# Patient Record
Sex: Male | Born: 1960 | Race: Black or African American | Hispanic: No | Marital: Married | Smoking: Former smoker
Health system: Southern US, Community
[De-identification: ages and names within clinical notes are randomized; demographics above are authoritative.]

## PROBLEM LIST (undated history)

## (undated) DIAGNOSIS — R011 Cardiac murmur, unspecified: Secondary | ICD-10-CM

## (undated) DIAGNOSIS — K4090 Unilateral inguinal hernia, without obstruction or gangrene, not specified as recurrent: Secondary | ICD-10-CM

## (undated) DIAGNOSIS — M549 Dorsalgia, unspecified: Secondary | ICD-10-CM

## (undated) DIAGNOSIS — C61 Malignant neoplasm of prostate: Secondary | ICD-10-CM

## (undated) HISTORY — PX: PROSTATE BIOPSY: SHX241

## (undated) HISTORY — PX: HERNIA REPAIR: SHX51

---

## 1998-03-31 ENCOUNTER — Emergency Department (HOSPITAL_COMMUNITY): Admission: EM | Admit: 1998-03-31 | Discharge: 1998-03-31 | Payer: Self-pay | Admitting: Emergency Medicine

## 1998-05-16 ENCOUNTER — Emergency Department (HOSPITAL_COMMUNITY): Admission: EM | Admit: 1998-05-16 | Discharge: 1998-05-16 | Payer: Self-pay | Admitting: Emergency Medicine

## 1998-07-10 ENCOUNTER — Emergency Department (HOSPITAL_COMMUNITY): Admission: EM | Admit: 1998-07-10 | Discharge: 1998-07-10 | Payer: Self-pay | Admitting: Emergency Medicine

## 1998-07-24 ENCOUNTER — Encounter: Admission: RE | Admit: 1998-07-24 | Discharge: 1998-10-22 | Payer: Self-pay | Admitting: *Deleted

## 1999-07-05 ENCOUNTER — Emergency Department (HOSPITAL_COMMUNITY): Admission: EM | Admit: 1999-07-05 | Discharge: 1999-07-05 | Payer: Self-pay | Admitting: *Deleted

## 1999-07-06 ENCOUNTER — Emergency Department (HOSPITAL_COMMUNITY): Admission: EM | Admit: 1999-07-06 | Discharge: 1999-07-06 | Payer: Self-pay | Admitting: *Deleted

## 2001-12-17 ENCOUNTER — Emergency Department (HOSPITAL_COMMUNITY): Admission: EM | Admit: 2001-12-17 | Discharge: 2001-12-17 | Payer: Self-pay

## 2003-03-30 ENCOUNTER — Emergency Department (HOSPITAL_COMMUNITY): Admission: EM | Admit: 2003-03-30 | Discharge: 2003-03-30 | Payer: Self-pay | Admitting: Emergency Medicine

## 2006-07-20 ENCOUNTER — Emergency Department (HOSPITAL_COMMUNITY): Admission: EM | Admit: 2006-07-20 | Discharge: 2006-07-20 | Payer: Self-pay | Admitting: Emergency Medicine

## 2007-07-02 ENCOUNTER — Emergency Department (HOSPITAL_COMMUNITY): Admission: EM | Admit: 2007-07-02 | Discharge: 2007-07-02 | Payer: Self-pay | Admitting: Emergency Medicine

## 2007-08-24 ENCOUNTER — Ambulatory Visit: Admission: RE | Admit: 2007-08-24 | Discharge: 2007-08-24 | Payer: Self-pay | Admitting: Internal Medicine

## 2007-08-26 ENCOUNTER — Ambulatory Visit (HOSPITAL_COMMUNITY): Admission: RE | Admit: 2007-08-26 | Discharge: 2007-08-26 | Payer: Self-pay | Admitting: Internal Medicine

## 2008-05-29 ENCOUNTER — Emergency Department (HOSPITAL_COMMUNITY): Admission: EM | Admit: 2008-05-29 | Discharge: 2008-05-29 | Payer: Self-pay | Admitting: Emergency Medicine

## 2008-05-30 ENCOUNTER — Ambulatory Visit: Payer: Self-pay | Admitting: Vascular Surgery

## 2008-05-30 ENCOUNTER — Ambulatory Visit (HOSPITAL_COMMUNITY): Admission: RE | Admit: 2008-05-30 | Discharge: 2008-05-30 | Payer: Self-pay | Admitting: Internal Medicine

## 2008-05-30 ENCOUNTER — Encounter (INDEPENDENT_AMBULATORY_CARE_PROVIDER_SITE_OTHER): Payer: Self-pay | Admitting: Family Medicine

## 2010-08-19 ENCOUNTER — Encounter: Payer: Self-pay | Admitting: Internal Medicine

## 2014-05-27 ENCOUNTER — Emergency Department (HOSPITAL_COMMUNITY)
Admission: EM | Admit: 2014-05-27 | Discharge: 2014-05-27 | Disposition: A | Discharge status: Home / Self Care | Payer: BC Managed Care – PPO | Attending: Emergency Medicine | Admitting: Emergency Medicine

## 2014-05-27 ENCOUNTER — Encounter (HOSPITAL_COMMUNITY): Payer: Self-pay | Admitting: Emergency Medicine

## 2014-05-27 DIAGNOSIS — Z792 Long term (current) use of antibiotics: Secondary | ICD-10-CM | POA: Insufficient documentation

## 2014-05-27 DIAGNOSIS — J329 Chronic sinusitis, unspecified: Secondary | ICD-10-CM | POA: Insufficient documentation

## 2014-05-27 DIAGNOSIS — R51 Headache: Secondary | ICD-10-CM | POA: Diagnosis present

## 2014-05-27 DIAGNOSIS — Z87891 Personal history of nicotine dependence: Secondary | ICD-10-CM | POA: Insufficient documentation

## 2014-05-27 DIAGNOSIS — R519 Headache, unspecified: Secondary | ICD-10-CM

## 2014-05-27 HISTORY — DX: Dorsalgia, unspecified: M54.9

## 2014-05-27 MED ORDER — DIPHENHYDRAMINE HCL 25 MG PO CAPS
25.0000 mg | ORAL_CAPSULE | Freq: Once | ORAL | Status: AC
  Administered 2014-05-27: 25 mg via ORAL
  Filled 2014-05-27: qty 1

## 2014-05-27 MED ORDER — KETOROLAC TROMETHAMINE 60 MG/2ML IM SOLN
60.0000 mg | Freq: Once | INTRAMUSCULAR | Status: AC
  Administered 2014-05-27: 60 mg via INTRAMUSCULAR
  Filled 2014-05-27: qty 2

## 2014-05-27 MED ORDER — DIPHENHYDRAMINE HCL 25 MG PO CAPS: 25.0000 mg | ORAL_CAPSULE | Freq: Three times a day (TID) | ORAL | Status: DC | PRN

## 2014-05-27 MED ORDER — IBUPROFEN 800 MG PO TABS: 800.0000 mg | ORAL_TABLET | Freq: Three times a day (TID) | ORAL | Status: DC | PRN

## 2014-05-27 NOTE — ED Notes (Signed)
MD at bedside. 

## 2014-05-27 NOTE — Discharge Instructions (Signed)
Headaches, Frequently Asked Questions Mr. Dupras, he was seen for headache. Continue to take Tylenol at home as needed for pain. Follow-up with a primary care physician within 3 days for continued treatment. If any of your symptoms worsen come back to the emergency department immediately for repeat evaluation. Thank you. MIGRAINE HEADACHES Q: What is migraine? What causes it? How can I treat it? A: Generally, migraine headaches begin as a dull ache. Then they develop into a constant, throbbing, and pulsating pain. You may experience pain at the temples. You may experience pain at the front or back of one or both sides of the head. The pain is usually accompanied by a combination of:  Nausea.  Vomiting.  Sensitivity to light and noise. Some people (about 15%) experience an aura (see below) before an attack. The cause of migraine is believed to be chemical reactions in the brain. Treatment for migraine may include over-the-counter or prescription medications. It may also include self-help techniques. These include relaxation training and biofeedback.  Q: What is an aura? A: About 15% of people with migraine get an "aura". This is a sign of neurological symptoms that occur before a migraine headache. You may see wavy or jagged lines, dots, or flashing lights. You might experience tunnel vision or blind spots in one or both eyes. The aura can include visual or auditory hallucinations (something imagined). It may include disruptions in smell (such as strange odors), taste or touch. Other symptoms include:  Numbness.  A "pins and needles" sensation.  Difficulty in recalling or speaking the correct word. These neurological events may last as long as 60 minutes. These symptoms will fade as the headache begins. Q: What is a trigger? A: Certain physical or environmental factors can lead to or "trigger" a migraine. These include:  Foods.  Hormonal changes.  Weather.  Stress. It is important to  remember that triggers are different for everyone. To help prevent migraine attacks, you need to figure out which triggers affect you. Keep a headache diary. This is a good way to track triggers. The diary will help you talk to your healthcare professional about your condition. Q: Does weather affect migraines? A: Bright sunshine, hot, humid conditions, and drastic changes in barometric pressure may lead to, or "trigger," a migraine attack in some people. But studies have shown that weather does not act as a trigger for everyone with migraines. Q: What is the link between migraine and hormones? A: Hormones start and regulate many of your body's functions. Hormones keep your body in balance within a constantly changing environment. The levels of hormones in your body are unbalanced at times. Examples are during menstruation, pregnancy, or menopause. That can lead to a migraine attack. In fact, about three quarters of all women with migraine report that their attacks are related to the menstrual cycle.  Q: Is there an increased risk of stroke for migraine sufferers? A: The likelihood of a migraine attack causing a stroke is very remote. That is not to say that migraine sufferers cannot have a stroke associated with their migraines. In persons under age 38, the most common associated factor for stroke is migraine headache. But over the course of a person's normal life span, the occurrence of migraine headache may actually be associated with a reduced risk of dying from cerebrovascular disease due to stroke.  Q: What are acute medications for migraine? A: Acute medications are used to treat the pain of the headache after it has started. Examples over-the-counter medications,  NSAIDs, ergots, and triptans.  Q: What are the triptans? A: Triptans are the newest class of abortive medications. They are specifically targeted to treat migraine. Triptans are vasoconstrictors. They moderate some chemical reactions in  the brain. The triptans work on receptors in your brain. Triptans help to restore the balance of a neurotransmitter called serotonin. Fluctuations in levels of serotonin are thought to be a main cause of migraine.  Q: Are over-the-counter medications for migraine effective? A: Over-the-counter, or "OTC," medications may be effective in relieving mild to moderate pain and associated symptoms of migraine. But you should see your caregiver before beginning any treatment regimen for migraine.  Q: What are preventive medications for migraine? A: Preventive medications for migraine are sometimes referred to as "prophylactic" treatments. They are used to reduce the frequency, severity, and length of migraine attacks. Examples of preventive medications include antiepileptic medications, antidepressants, beta-blockers, calcium channel blockers, and NSAIDs (nonsteroidal anti-inflammatory drugs). Q: Why are anticonvulsants used to treat migraine? A: During the past few years, there has been an increased interest in antiepileptic drugs for the prevention of migraine. They are sometimes referred to as "anticonvulsants". Both epilepsy and migraine may be caused by similar reactions in the brain.  Q: Why are antidepressants used to treat migraine? A: Antidepressants are typically used to treat people with depression. They may reduce migraine frequency by regulating chemical levels, such as serotonin, in the brain.  Q: What alternative therapies are used to treat migraine? A: The term "alternative therapies" is often used to describe treatments considered outside the scope of conventional Western medicine. Examples of alternative therapy include acupuncture, acupressure, and yoga. Another common alternative treatment is herbal therapy. Some herbs are believed to relieve headache pain. Always discuss alternative therapies with your caregiver before proceeding. Some herbal products contain arsenic and other toxins. TENSION  HEADACHES Q: What is a tension-type headache? What causes it? How can I treat it? A: Tension-type headaches occur randomly. They are often the result of temporary stress, anxiety, fatigue, or anger. Symptoms include soreness in your temples, a tightening band-like sensation around your head (a "vice-like" ache). Symptoms can also include a pulling feeling, pressure sensations, and contracting head and neck muscles. The headache begins in your forehead, temples, or the back of your head and neck. Treatment for tension-type headache may include over-the-counter or prescription medications. Treatment may also include self-help techniques such as relaxation training and biofeedback. CLUSTER HEADACHES Q: What is a cluster headache? What causes it? How can I treat it? A: Cluster headache gets its name because the attacks come in groups. The pain arrives with little, if any, warning. It is usually on one side of the head. A tearing or bloodshot eye and a runny nose on the same side of the headache may also accompany the pain. Cluster headaches are believed to be caused by chemical reactions in the brain. They have been described as the most severe and intense of any headache type. Treatment for cluster headache includes prescription medication and oxygen. SINUS HEADACHES Q: What is a sinus headache? What causes it? How can I treat it? A: When a cavity in the bones of the face and skull (a sinus) becomes inflamed, the inflammation will cause localized pain. This condition is usually the result of an allergic reaction, a tumor, or an infection. If your headache is caused by a sinus blockage, such as an infection, you will probably have a fever. An x-ray will confirm a sinus blockage. Your caregiver's treatment might  include antibiotics for the infection, as well as antihistamines or decongestants.  REBOUND HEADACHES Q: What is a rebound headache? What causes it? How can I treat it? A: A pattern of taking acute  headache medications too often can lead to a condition known as "rebound headache." A pattern of taking too much headache medication includes taking it more than 2 days per week or in excessive amounts. That means more than the label or a caregiver advises. With rebound headaches, your medications not only stop relieving pain, they actually begin to cause headaches. Doctors treat rebound headache by tapering the medication that is being overused. Sometimes your caregiver will gradually substitute a different type of treatment or medication. Stopping may be a challenge. Regularly overusing a medication increases the potential for serious side effects. Consult a caregiver if you regularly use headache medications more than 2 days per week or more than the label advises. ADDITIONAL QUESTIONS AND ANSWERS Q: What is biofeedback? A: Biofeedback is a self-help treatment. Biofeedback uses special equipment to monitor your body's involuntary physical responses. Biofeedback monitors:  Breathing.  Pulse.  Heart rate.  Temperature.  Muscle tension.  Brain activity. Biofeedback helps you refine and perfect your relaxation exercises. You learn to control the physical responses that are related to stress. Once the technique has been mastered, you do not need the equipment any more. Q: Are headaches hereditary? A: Four out of five (80%) of people that suffer report a family history of migraine. Scientists are not sure if this is genetic or a family predisposition. Despite the uncertainty, a child has a 50% chance of having migraine if one parent suffers. The child has a 75% chance if both parents suffer.  Q: Can children get headaches? A: By the time they reach high school, most young people have experienced some type of headache. Many safe and effective approaches or medications can prevent a headache from occurring or stop it after it has begun.  Q: What type of doctor should I see to diagnose and treat my  headache? A: Start with your primary caregiver. Discuss his or her experience and approach to headaches. Discuss methods of classification, diagnosis, and treatment. Your caregiver may decide to recommend you to a headache specialist, depending upon your symptoms or other physical conditions. Having diabetes, allergies, etc., may require a more comprehensive and inclusive approach to your headache. The National Headache Foundation will provide, upon request, a list of [facility] physician members in your state. Document Released: 10/05/2003 Document Revised: 10/07/2011 Document Reviewed: 03/14/2008 [facility] Patient Information 2015 [place], [place]. This information is not intended to replace advice given to you by your health care provider. Make sure you discuss any questions you have with your health care provider. Sinusitis Sinusitis is redness, soreness, and puffiness (inflammation) of the air pockets in the bones of your face (sinuses). The redness, soreness, and puffiness can cause air and mucus to get trapped in your sinuses. This can allow germs to grow and cause an infection.  HOME CARE   Drink enough fluids to keep your pee (urine) clear or pale yellow.  Use a humidifier in your home.  Run a hot shower to create steam in the bathroom. Sit in the bathroom with the door closed. Breathe in the steam 3-4 times a day.  Put a warm, moist washcloth on your face 3-4 times a day, or as told by your doctor.  Use salt water sprays (saline sprays) to wet the thick fluid in your nose. This can  help the sinuses drain.  Only take medicine as told by your doctor. GET HELP RIGHT AWAY IF:   Your pain gets worse.  You have very bad headaches.  You are sick to your stomach (nauseous).  You throw up (vomit).  You are very sleepy (drowsy) all the time.  Your face is puffy (swollen).  Your vision changes.  You have a stiff neck.  You have trouble breathing. MAKE SURE YOU:   Understand these  instructions.  Will watch your condition.  Will get help right away if you are not doing well or get worse. Document Released: 01/01/2008 Document Revised: 04/08/2012 Document Reviewed: 02/18/2012 [facility] Patient Information 2015 [place], [place]. This information is not intended to replace advice given to you by your health care provider. Make sure you discuss any questions you have with your health care provider.  Emergency Department Resource Guide 1) Find a Doctor and Pay Out of Pocket Although you won't have to find out who is covered by your insurance plan, it is a good idea to ask around and get recommendations. You will then need to call the office and see if the doctor you have chosen will accept you as a new patient and what types of options they offer for patients who are self-pay. Some doctors offer discounts or will set up payment plans for their patients who do not have insurance, but you will need to ask so you aren't surprised when you get to your appointment.  2) Contact Your Local Health Department Not all health departments have doctors that can see patients for sick visits, but many do, so it is worth a call to see if yours does. If you don't know where your local health department is, you can check in your phone book. The CDC also has a tool to help you locate your state's health department, and many state websites also have listings of all of their local health departments.  3) Find a [facility] If your illness is not likely to be very severe or complicated, you may want to try a walk in clinic. These are popping up all over the country in pharmacies, drugstores, and shopping centers. They're usually staffed by nurse practitioners or physician assistants that have been trained to treat common illnesses and complaints. They're usually fairly quick and inexpensive. However, if you have serious medical issues or chronic medical problems, these are probably not your best  option.  No Primary Care Doctor: - Call Health Connect at  [phone] - they can help you locate a primary care doctor that  accepts your insurance, provides certain services, etc. - Physician Referral Service- [phone]  Chronic Pain Problems: Organization         Address  Phone   Notes  [facility]  [phone] Patients need to be referred by their primary care doctor.   Medication Assistance: Organization         Address  Phone   Notes  [facility] Medication [facility] [address] [place], [place] [address] [phone] --Must be a resident of [facility] -- Must have NO insurance coverage whatsoever (no Medicaid/ Medicare, etc.) -- The pt. MUST have a primary care doctor that directs their care regularly and follows them in the community   MedAssist  [phone]   [employer]  [phone]    Agencies that provide inexpensive medical care: Patent attorney  Notes  [place]  [phone]   [name]    [phone]   [facility] [place], [place] [address] [phone]   [place] [address] [place] [phone]   Planned Parenthood    [phone]   [facility]    [phone]   [place] and [place] [address], [place] Phone:  [phone], Fax:  [phone] Hours of Operation:  9 am - 6 pm, M-F.  Also accepts Medicaid/Medicare and self-pay.  [facility] for [place], [address], [place] Phone: [phone], Fax: [phone]. Hours of Operation:  8:30 am - 5:30 pm, M-F.  Also accepts Medicaid and self-pay.  [facility] [place] [address], [place] Phone: [phone]   [place], [place], [place] [phone], Ext. [phone] Mondays & Thursdays: 7-9 AM.  First 15  patients are seen on a first come, first serve basis.    [place] Providers:  Organization         Address  Phone   Notes  [facility] [address], [place] [phone] Also accepts self-pay patients.  [facility] [address], [place]  [phone]   [place], [address], [place] [phone]   [facility] [address], [place] [phone]   Lucianne Lei [address], [place]   [phone] Only accepts [place] Access [place] patients after they have their name applied to their card.   Self-Pay (no insurance) in [facility]:  Organization         Address  Phone   Notes  Sickle Cell Patients, [facility] [place] [phone]   [facility] [place] [phone]   [name] [place]  [place], [place], [place] [phone]   [facility]/Dr. Osei-Bonsu  [address], [place] or [address], [place] [phone] Phone number for both [place] and [place] locations is the same.  Urgent Medical and [facility] [address] [place] [phone]   [facility] [address] [place] or [address] [phone]   [facility] [address] [place] [phone], phone; [phone], fax Sees patients 1st and 3rd Saturday of every month.  Must not qualify for public or private insurance (i.e. Medicaid, Medicare, [place] Health Choice, Veterans' Benefits)  Household income should be no more than 200% of the poverty level The clinic cannot treat you if you are pregnant or think you are pregnant  Sexually transmitted diseases are not treated at the clinic.    Dental  Care: Organization         Address  Phone  Notes  [facility] Department of [facility] [place] [phone] Accepts children up to age 77 who are enrolled in [place] or [place]; pregnant women with a Medicaid card; and children who have applied for Medicaid or [place] Health Choice, but were declined, whose parents can pay a reduced fee at time of service.  [facility]  Department of [facility]  [address], [place] [phone] Accepts children up to age 36 who are enrolled in [place] or [place]; pregnant women with a Medicaid card; and children who have applied for Medicaid or [place] Health Choice, but were declined, whose parents can pay a reduced fee at time of service.  [facility] Adult [facility] Access PRO[facility]  [place] [phone] Patients are seen by appointment only. Walk-ins are not accepted. [place] will see patients 24 years of age and older. Monday - Tuesday (8am-5pm) Most Wednesdays (8:30-5pm) $30 per visit, cash only  [facility] Adult Dental Access PROGRAM  [address], [facility] [phone] Patients are seen by appointment only. Walk-ins are not accepted. [place] will see patients 31 years of age and older. One Wednesday Evening (Monthly: Volunteer Based).  $30 per visit, cash only  [place]  [phone] for adults; Children under age 9, call Graduate Pediatric Dentistry at [phone]. Children aged 87-14, please call [phone] to request a pediatric application.  Dental services are provided in all areas of dental care including fillings, crowns and bridges, complete and partial dentures, implants, gum treatment, root canals, and extractions. Preventive care is also provided. Treatment is provided to both adults and children. Patients are selected via a lottery and there is often a waiting list.   [facility] [address], [place]  [phone] [email or web address]   [facility] [address] [place], [place] [phone], Ext. [phone] Second and Fourth Thursday of each month, opens at 6:30 AM; Clinic ends at 9 AM.  Patients are seen on a first-come first-served basis, and a limited number are seen during each clinic.   [facility]  [address] Hillard Danker [place], [place] [phone]   Eligibility Requirements You must have lived in [place], [place], or [place] counties for at least the last three months.   You cannot be eligible for state or federal sponsored [employer], including [employer], [place], or [employer].   You generally cannot be eligible for healthcare insurance through your employer.    How to apply: Eligibility screenings are held every Tuesday and Wednesday afternoon from 1:00 pm until 4:00 pm. You do not need an appointment for the interview!  [facility] [address], [place], [place]   [place]  [place] Department  [place]  [phone]    Behavioral Health Resources in the Community: Intensive Outpatient Programs Organization         Address  Phone  Notes  [place]. [address] [place], [place] [phone]   [facility] [address] [place], [place]   ADS: Alcohol & Drug [facility] [address] [place], [place]   [place] [address]  [place], [place] or [phone]   Substance Abuse Resources Organization         Address  Phone  Notes  Alcohol and Drug Services  [phone]   [place]  [phone]   [facility] [place]   Chinita Pester  [phone]   Residential & Outpatient Substance Abuse Program  [phone]    Psychological Services Organization         Address  Phone  Notes  [facility]  [place]   [place] [address] [place] or [phone]    Mobile Crisis Teams Organization         Address  Phone  Notes  [facility], Mobile Crisis Care Unit  [phone]   [facility]otherapeutic [facility]  [address] [place], [place]   [place] Levels [address] [place] [phone]    Self-Help/Support Groups Organization         Address  Phone             Notes  [place]. of [place] - variety of support groups  [place] Call for more information  Narcotics Anonymous (NA), [facility] [address], [employer] [place]  2 meetings at this location   Special educational needs teacher         Address  Phone  Notes  [facility]AP Residential Treatment [place],    [place]  [phone]   [facility]  [address] [place] [address], [place], [place]   [place], [place] [phone] Admissions: 8am-3pm M-F  Incentives Substance [place] [address],    [place], [place] [phone]   [facility] [address] [place], [place], [place]   [facility] [address],  [place], [place]   [facility] Programs - Intensive Outpatient [address], [place], [place]   [facility] [address]  [place], [place] [phone] or [phone]   [facility] Services (RTS) [address], [place], [place] Accepts Medicaid  [facility] [place] [address],  [place] [phone] Substance Abuse/Addiction Treatment   [facility] Organization         Address  Phone  Notes  [facility]  [phone]   Domenic Schwab, PhD [address] [place], [place]   [phone] or [phone]   [place], [place] [phone]   [facility] [address] [place], [place] [phone] Insurance/Medicaid/sponsorship through [facility] and Families [address], [address] [place]                                    [place], [place] [phone] [place] [address] [place], [place] [phone]    Dr. Adele Schilder  [phone]   [facility] [place] Dept. 1) [address]  2) [place] 3)  [address], [place] [phone]  [phone]   [place] [phone] or [phone] (After Hours)

## 2014-05-27 NOTE — ED Provider Notes (Signed)
CSN: [id number]     Arrival date & time 05/27/14  0420 History   First MD Initiated Contact with Patient 05/27/14 [phone]     Chief Complaint  Patient presents with  . Headache     (Consider location/radiation/quality/duration/timing/severity/associated sxs/prior Treatment) HPI Ryan Wilson is a 53 y.o. male with no significant past medical history coming in with headache. Patient states this is gone on for the past 4 days. He believes this is associated with his sinuses. This occurs to him every year. His headache is described as sharp and it has been worse at night. It is a diffuse headache and gradual in onset. He denies any blurry vision, muscle weakness, numbness or tingling. He was seen at an urgent care, given pain medication and antibiotics without any relief. Patient states he is no longer able to sleep at night due to the pain. He also complains of tearing in his eyes consistent with his allergies. He admits to congestion and mild rhinorrhea. There's been no seizing coughing fevers chest pain or shortness of breath. Patient has no further complaints.  10 Systems reviewed and are negative for acute change except as noted in the HPI.   Past Medical History  Diagnosis Date  . Back pain    Past Surgical History  Procedure Laterality Date  . Hernia repair  1995, 1996   No family history on file. History  Substance Use Topics  . Smoking status: Former Research scientist (life sciences)  . Smokeless tobacco: Not on file  . Alcohol Use: Yes    Review of Systems    Allergies  Review of patient's allergies indicates no known allergies.  Home Medications   Prior to Admission medications   Medication Sig Start Date End Date Taking? Authorizing Provider  azithromycin (ZITHROMAX) 250 MG tablet Take 250-500 mg by mouth daily. Take 500mg  on day one and 250mg  for the next 4 days.   Yes Historical Provider, MD  HYDROcodone-acetaminophen (NORCO/VICODIN) 5-325 MG per tablet Take 1-2 tablets by mouth every 6 (six)  hours as needed for moderate pain.   Yes Historical Provider, MD  phenylephrine (SUDAFED PE) 10 MG TABS tablet Take 20 mg by mouth every 4 (four) hours as needed (congestion).   Yes Historical Provider, MD  diphenhydrAMINE (BENADRYL) 25 mg capsule Take 1 capsule (25 mg total) by mouth every 8 (eight) hours as needed (headache). 05/27/14   Everlene Balls, MD  ibuprofen (ADVIL,MOTRIN) 800 MG tablet Take 1 tablet (800 mg total) by mouth every 8 (eight) hours as needed (headache). 05/27/14   Everlene Balls, MD   BP 116/82  Pulse 66  Temp(Src) 97.9 F (36.6 C) (Oral)  Resp 20  Ht 5\' 10"  (1.778 m)  Wt 170 lb (77.111 kg)  BMI 24.39 kg/m2  SpO2 97% Physical Exam  Nursing note and vitals reviewed. Constitutional: He is oriented to person, place, and time. Vital signs are normal. He appears well-developed and well-nourished.  Non-toxic appearance. He does not appear ill. No distress.  HENT:  Head: Normocephalic and atraumatic.  Mouth/Throat: Oropharynx is clear and moist. No oropharyngeal exudate.  Frontal and bilateral sinus tenderness to palpation. Mild rhinorrhea present.  Eyes: Conjunctivae and EOM are normal. Pupils are equal, round, and reactive to light. No scleral icterus.  Neck: Normal range of motion. Neck supple. No tracheal deviation, no edema, no erythema and normal range of motion present. No mass and no thyromegaly present.  Cardiovascular: Normal rate, regular rhythm, S1 normal, S2 normal, normal heart sounds, intact distal pulses and  normal pulses.  Exam reveals no gallop and no friction rub.   No murmur heard. Pulses:      Radial pulses are 2+ on the right side, and 2+ on the left side.       Dorsalis pedis pulses are 2+ on the right side, and 2+ on the left side.  Pulmonary/Chest: Effort normal and breath sounds normal. No respiratory distress. He has no wheezes. He has no rhonchi. He has no rales.  Abdominal: Soft. Normal appearance and bowel sounds are normal. He exhibits no  distension, no ascites and no mass. There is no hepatosplenomegaly. There is no tenderness. There is no rebound, no guarding and no CVA tenderness.  Musculoskeletal: Normal range of motion. He exhibits no edema and no tenderness.  Lymphadenopathy:    He has no cervical adenopathy.  Neurological: He is alert and oriented to person, place, and time. He has normal strength. No cranial nerve deficit or sensory deficit. He exhibits normal muscle tone. GCS eye subscore is 4. GCS verbal subscore is 5. GCS motor subscore is 6.  Skin: Skin is warm, dry and intact. No petechiae and no rash noted. He is not diaphoretic. No erythema. No pallor.  Psychiatric: He has a normal mood and affect. His behavior is normal. Judgment normal.    ED Course  Procedures (including critical care time) Labs Review Labs Reviewed - No data to display  Imaging Review No results found.   EKG Interpretation None      MDM   Final diagnoses:  Sinus headache    Patient presents to the emergency department out of concern for headache of gradual onset, mild rhinorrhea, tearing of the eyes. This is likely due to his allergies versus a sinus headache. I do not believe the patient needs antibiotics for the sinusitis. He was given Toradol and Benadryl in the emergency department. Upon my repeated evaluation patient was resting comfortably in the bed, no acute distress, appearing more comfortable than when he arrived. Patient also subjectively feels much better. He'll be given prescription for Motrin and Benadryl to take at home as needed. He is advised to follow-up with primary care physician. Patient indicates understanding, his vitals are within his normal limits and he is safe for discharge.    Everlene Balls, MD 05/27/14 [phone]

## 2014-05-27 NOTE — ED Notes (Signed)
Pt reports he was seen at his primary care last Friday and dx with a sinus infection and bronchitis. Pt reports he has about three pills left of his antibiotic and was prescribed some pain medication. Pt reports he still does not feel well. Pt reports a HA, clear nasal drainage, body aches, and chills. A&O X4.

## 2016-04-21 ENCOUNTER — Encounter (HOSPITAL_COMMUNITY): Payer: Self-pay | Admitting: *Deleted

## 2016-04-21 ENCOUNTER — Emergency Department (HOSPITAL_COMMUNITY): Payer: No Typology Code available for payment source

## 2016-04-21 ENCOUNTER — Emergency Department (HOSPITAL_COMMUNITY)
Admission: EM | Admit: 2016-04-21 | Discharge: 2016-04-21 | Disposition: A | Discharge status: Home / Self Care | Payer: No Typology Code available for payment source | Attending: Emergency Medicine | Admitting: Emergency Medicine

## 2016-04-21 DIAGNOSIS — Z87891 Personal history of nicotine dependence: Secondary | ICD-10-CM | POA: Diagnosis not present

## 2016-04-21 DIAGNOSIS — Y92410 Unspecified street and highway as the place of occurrence of the external cause: Secondary | ICD-10-CM | POA: Diagnosis not present

## 2016-04-21 DIAGNOSIS — Y999 Unspecified external cause status: Secondary | ICD-10-CM | POA: Diagnosis not present

## 2016-04-21 DIAGNOSIS — S199XXA Unspecified injury of neck, initial encounter: Secondary | ICD-10-CM | POA: Diagnosis present

## 2016-04-21 DIAGNOSIS — S39012A Strain of muscle, fascia and tendon of lower back, initial encounter: Secondary | ICD-10-CM | POA: Diagnosis not present

## 2016-04-21 DIAGNOSIS — Y939 Activity, unspecified: Secondary | ICD-10-CM | POA: Diagnosis not present

## 2016-04-21 DIAGNOSIS — S161XXA Strain of muscle, fascia and tendon at neck level, initial encounter: Secondary | ICD-10-CM | POA: Diagnosis not present

## 2016-04-21 MED ORDER — NAPROXEN 500 MG PO TABS: 500.0000 mg | ORAL_TABLET | Freq: Two times a day (BID) | ORAL | 0 refills | Status: DC

## 2016-04-21 MED ORDER — KETOROLAC TROMETHAMINE 60 MG/2ML IM SOLN
30.0000 mg | Freq: Once | INTRAMUSCULAR | Status: DC
  Filled 2016-04-21: qty 2

## 2016-04-21 MED ORDER — IBUPROFEN 400 MG PO TABS
800.0000 mg | ORAL_TABLET | Freq: Once | ORAL | Status: AC
  Administered 2016-04-21: 800 mg via ORAL
  Filled 2016-04-21: qty 2

## 2016-04-21 MED ORDER — DIAZEPAM 5 MG PO TABS
5.0000 mg | ORAL_TABLET | Freq: Four times a day (QID) | ORAL | 0 refills | Status: DC | PRN
Start: 2016-04-21 — End: 2017-07-09
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
## 2016-04-21 NOTE — ED Notes (Signed)
Pt ambulatory to FT 2 - w/o difficulty.
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
## 2016-04-21 NOTE — Discharge Instructions (Signed)
Take naprosyn for pain and inflammation. Take valium for muscle spasms as prescribed as needed. Try heating pads. Rest. Stretches. Follow up with primary care doctor. Return if worsening symptoms.

## 2016-04-21 NOTE — ED Notes (Signed)
Pt refusing injection - states is afraid of needles. Prefers po med. Adolm Joseph, PA, aware.
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
## 2016-12-14 ENCOUNTER — Emergency Department (HOSPITAL_COMMUNITY)
Admission: EM | Admit: 2016-12-14 | Discharge: 2016-12-14 | Disposition: A | Discharge status: Home / Self Care | Payer: BLUE CROSS/BLUE SHIELD | Attending: Emergency Medicine | Admitting: Emergency Medicine

## 2016-12-14 ENCOUNTER — Emergency Department (HOSPITAL_COMMUNITY): Payer: BLUE CROSS/BLUE SHIELD

## 2016-12-14 ENCOUNTER — Encounter (HOSPITAL_COMMUNITY): Payer: Self-pay | Admitting: Emergency Medicine

## 2016-12-14 DIAGNOSIS — S161XXA Strain of muscle, fascia and tendon at neck level, initial encounter: Secondary | ICD-10-CM | POA: Diagnosis not present

## 2016-12-14 DIAGNOSIS — Y92410 Unspecified street and highway as the place of occurrence of the external cause: Secondary | ICD-10-CM | POA: Diagnosis not present

## 2016-12-14 DIAGNOSIS — S3992XA Unspecified injury of lower back, initial encounter: Secondary | ICD-10-CM | POA: Diagnosis present

## 2016-12-14 DIAGNOSIS — Z79899 Other long term (current) drug therapy: Secondary | ICD-10-CM | POA: Insufficient documentation

## 2016-12-14 DIAGNOSIS — Y999 Unspecified external cause status: Secondary | ICD-10-CM | POA: Insufficient documentation

## 2016-12-14 DIAGNOSIS — Y939 Activity, unspecified: Secondary | ICD-10-CM | POA: Diagnosis not present

## 2016-12-14 DIAGNOSIS — S39012A Strain of muscle, fascia and tendon of lower back, initial encounter: Secondary | ICD-10-CM | POA: Diagnosis not present

## 2016-12-14 DIAGNOSIS — Z87891 Personal history of nicotine dependence: Secondary | ICD-10-CM | POA: Diagnosis not present

## 2016-12-14 MED ORDER — CYCLOBENZAPRINE HCL 10 MG PO TABS: 10.0000 mg | ORAL_TABLET | Freq: Two times a day (BID) | ORAL | 0 refills | Status: DC | PRN

## 2016-12-14 MED ORDER — IBUPROFEN 800 MG PO TABS
800.0000 mg | ORAL_TABLET | Freq: Once | ORAL | Status: AC
  Administered 2016-12-14: 800 mg via ORAL
  Filled 2016-12-14: qty 1

## 2016-12-14 MED ORDER — CYCLOBENZAPRINE HCL 10 MG PO TABS
10.0000 mg | ORAL_TABLET | Freq: Once | ORAL | Status: AC
  Administered 2016-12-14: 10 mg via ORAL
  Filled 2016-12-14: qty 1

## 2016-12-14 MED ORDER — DICLOFENAC SODIUM 50 MG PO TBEC: 50.0000 mg | DELAYED_RELEASE_TABLET | Freq: Two times a day (BID) | ORAL | 0 refills | Status: DC

## 2016-12-14 NOTE — Discharge Instructions (Signed)
Do not take the muscle relaxer if driving as it will make you sleepy. °
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
## 2016-12-14 NOTE — ED Triage Notes (Signed)
Pt given c collar

## 2016-12-18 ENCOUNTER — Telehealth (HOSPITAL_COMMUNITY): Payer: Self-pay | Admitting: Internal Medicine

## 2016-12-18 ENCOUNTER — Encounter (HOSPITAL_COMMUNITY): Payer: Self-pay | Admitting: Emergency Medicine

## 2016-12-18 ENCOUNTER — Ambulatory Visit (HOSPITAL_COMMUNITY)
Admission: EM | Admit: 2016-12-18 | Discharge: 2016-12-18 | Disposition: A | Discharge status: Home / Self Care | Payer: BLUE CROSS/BLUE SHIELD | Attending: Internal Medicine | Admitting: Internal Medicine

## 2016-12-18 DIAGNOSIS — S39012A Strain of muscle, fascia and tendon of lower back, initial encounter: Secondary | ICD-10-CM

## 2016-12-18 DIAGNOSIS — S161XXD Strain of muscle, fascia and tendon at neck level, subsequent encounter: Secondary | ICD-10-CM

## 2016-12-18 DIAGNOSIS — S39012D Strain of muscle, fascia and tendon of lower back, subsequent encounter: Secondary | ICD-10-CM

## 2016-12-18 MED ORDER — NAPROXEN 500 MG PO TABS: 500.0000 mg | ORAL_TABLET | Freq: Two times a day (BID) | ORAL | 0 refills | Status: DC

## 2016-12-18 MED ORDER — KETOROLAC TROMETHAMINE 60 MG/2ML IM SOLN
INTRAMUSCULAR | Status: AC
  Filled 2016-12-18: qty 2

## 2016-12-18 MED ORDER — KETOROLAC TROMETHAMINE 60 MG/2ML IM SOLN
60.0000 mg | Freq: Once | INTRAMUSCULAR | Status: AC
  Administered 2016-12-18: 60 mg via INTRAMUSCULAR

## 2016-12-18 MED ORDER — HYDROCODONE-ACETAMINOPHEN 5-325 MG PO TABS: 1.0000 | ORAL_TABLET | Freq: Four times a day (QID) | ORAL | 0 refills | Status: DC | PRN
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
## 2016-12-25 ENCOUNTER — Telehealth: Payer: Self-pay | Admitting: Internal Medicine

## 2016-12-25 MED ORDER — NAPROXEN 500 MG PO TABS: 500.0000 mg | ORAL_TABLET | Freq: Two times a day (BID) | ORAL | 0 refills | Status: DC

## 2016-12-25 NOTE — Telephone Encounter (Signed)
Patient presents to UC to followup on PT referral, referral made 5/23 but has not heard anything yet.  Also would like refill of naproxen.  We will check on referral and refill naproxen.  LM

## 2017-03-03 ENCOUNTER — Other Ambulatory Visit: Payer: Self-pay | Admitting: Urology

## 2017-07-15 NOTE — Patient Instructions (Signed)
Jeremyah Jelley  07/15/2017   Your procedure is scheduled on: 07-18-17  Report to [facility] Main  Entrance Take [place]  elevators to 3rd floor to  [place] at 530AM.   Call this number if you have problems the morning of surgery [phone]   Remember: ONLY 1 PERSON MAY GO WITH YOU TO SHORT STAY TO GET  READY MORNING OF YOUR SURGERY.  Do not eat food or drink liquids :After Midnight.     Take these medicines the morning of surgery with A SIP OF WATER: none                                You may not have any metal on your body including hair pins and              piercings  Do not wear jewelry, make-up, lotions, powders or perfumes, deodorant              Men may shave face and neck.   Do not bring valuables to the hospital. [place].  Contacts, dentures or bridgework may not be worn into surgery.  Leave suitcase in the car. After surgery it may be brought to your room.                Please read over the following fact sheets you were given: _____________________________________________________________________             [facility] - Preparing for Surgery Before surgery, you can play an important role.  Because skin is not sterile, your skin needs to be as free of germs as possible.  You can reduce the number of germs on your skin by washing with CHG (chlorahexidine gluconate) soap before surgery.  CHG is an antiseptic cleaner which kills germs and bonds with the skin to continue killing germs even after washing. Please DO NOT use if you have an allergy to CHG or antibacterial soaps.  If your skin becomes reddened/irritated stop using the CHG and inform your nurse when you arrive at Short Stay. Do not shave (including legs and underarms) for at least 48 hours prior to the first CHG shower.  You may shave your face/neck. Please follow these instructions carefully:  1.  Shower with CHG Soap the  night before surgery and the  morning of Surgery.  2.  If you choose to wash your hair, wash your hair first as usual with your  normal  shampoo.  3.  After you shampoo, rinse your hair and body thoroughly to remove the  shampoo.                           4.  Use CHG as you would any other liquid soap.  You can apply chg directly  to the skin and wash                       Gently with a scrungie or clean washcloth.  5.  Apply the CHG Soap to your body ONLY FROM THE NECK DOWN.   Do not use on face/ open  Wound or open sores. Avoid contact with eyes, ears mouth and genitals (private parts).                       Wash face,  Genitals (private parts) with your normal soap.             6.  Wash thoroughly, paying special attention to the area where your surgery  will be performed.  7.  Thoroughly rinse your body with warm water from the neck down.  8.  DO NOT shower/wash with your normal soap after using and rinsing off  the CHG Soap.                9.  Pat yourself dry with a clean towel.            10.  Wear clean pajamas.            11.  Place clean sheets on your bed the night of your first shower and do not  sleep with pets. Day of Surgery : Do not apply any lotions/deodorants the morning of surgery.  Please wear clean clothes to the hospital/surgery center.  FAILURE TO FOLLOW THESE INSTRUCTIONS MAY RESULT IN THE CANCELLATION OF YOUR SURGERY PATIENT SIGNATURE_________________________________  NURSE SIGNATURE__________________________________  ________________________________________________________________________

## 2017-07-15 NOTE — Progress Notes (Signed)
PLACE ORDERS IN Epic. PRE-OP APPT 07-16-17 . THANKS !

## 2017-07-16 ENCOUNTER — Encounter (INDEPENDENT_AMBULATORY_CARE_PROVIDER_SITE_OTHER): Payer: Self-pay

## 2017-07-16 ENCOUNTER — Encounter (HOSPITAL_COMMUNITY)
Admission: RE | Admit: 2017-07-16 | Discharge: 2017-07-16 | Disposition: A | Discharge status: Home / Self Care | Payer: BLUE CROSS/BLUE SHIELD | Source: Ambulatory Visit | Attending: Urology | Admitting: Urology

## 2017-07-16 ENCOUNTER — Other Ambulatory Visit: Payer: Self-pay

## 2017-07-16 ENCOUNTER — Encounter (HOSPITAL_COMMUNITY): Payer: Self-pay

## 2017-07-16 DIAGNOSIS — R011 Cardiac murmur, unspecified: Secondary | ICD-10-CM | POA: Diagnosis not present

## 2017-07-16 DIAGNOSIS — M549 Dorsalgia, unspecified: Secondary | ICD-10-CM | POA: Diagnosis not present

## 2017-07-16 DIAGNOSIS — C61 Malignant neoplasm of prostate: Secondary | ICD-10-CM | POA: Diagnosis present

## 2017-07-16 DIAGNOSIS — Z87891 Personal history of nicotine dependence: Secondary | ICD-10-CM | POA: Diagnosis not present

## 2017-07-16 DIAGNOSIS — K4091 Unilateral inguinal hernia, without obstruction or gangrene, recurrent: Secondary | ICD-10-CM | POA: Diagnosis not present

## 2017-07-16 HISTORY — DX: Unilateral inguinal hernia, without obstruction or gangrene, not specified as recurrent: K40.90

## 2017-07-16 HISTORY — DX: Cardiac murmur, unspecified: R01.1

## 2017-07-16 HISTORY — DX: Malignant neoplasm of prostate: C61

## 2017-07-16 LAB — BASIC METABOLIC PANEL
Anion gap: 6 (ref 5–15)
BUN: 15 mg/dL (ref 6–20)
CO2: 26 mmol/L (ref 22–32)
Calcium: 9 mg/dL (ref 8.9–10.3)
Chloride: 106 mmol/L (ref 101–111)
Creatinine, Ser: 0.83 mg/dL (ref 0.61–1.24)
GFR calc Af Amer: >60 mL/min (ref >60)
GFR calc non Af Amer: >60 mL/min (ref >60)
Glucose, Bld: 137 mg/dL — ABNORMAL HIGH (ref 65–99)
Potassium: 3.6 mmol/L (ref 3.5–5.1)
SODIUM: 138 mmol/L (ref 135–145)

## 2017-07-16 LAB — CBC
HCT: 45.4 % (ref 39.0–52.0)
HEMOGLOBIN: 16 g/dL (ref 13.0–17.0)
MCH: 31.8 pg (ref 26.0–34.0)
MCHC: 35.2 g/dL (ref 30.0–36.0)
MCV: 90.3 fL (ref 78.0–100.0)
Platelets: 192 10*3/uL (ref 150–400)
RBC: 5.03 MIL/uL (ref 4.22–5.81)
RDW: 12.3 % (ref 11.5–15.5)
WBC: 6.7 10*3/uL (ref 4.0–10.5)

## 2017-07-17 ENCOUNTER — Other Ambulatory Visit: Payer: Self-pay | Admitting: Urology

## 2017-07-18 ENCOUNTER — Encounter (HOSPITAL_COMMUNITY): Payer: Self-pay | Admitting: *Deleted

## 2017-07-18 ENCOUNTER — Encounter (HOSPITAL_COMMUNITY)
Admission: RE | Disposition: A | Discharge status: Home / Self Care | Payer: Self-pay | Source: Ambulatory Visit | Attending: Urology

## 2017-07-18 ENCOUNTER — Ambulatory Visit (HOSPITAL_COMMUNITY): Payer: BLUE CROSS/BLUE SHIELD | Admitting: Certified Registered Nurse Anesthetist

## 2017-07-18 ENCOUNTER — Other Ambulatory Visit: Payer: Self-pay

## 2017-07-18 ENCOUNTER — Observation Stay (HOSPITAL_COMMUNITY)
Admission: RE | Admit: 2017-07-18 | Discharge: 2017-07-19 | Disposition: A | Discharge status: Home / Self Care | Payer: BLUE CROSS/BLUE SHIELD | Source: Ambulatory Visit | Attending: Urology | Admitting: Urology

## 2017-07-18 DIAGNOSIS — R011 Cardiac murmur, unspecified: Secondary | ICD-10-CM | POA: Insufficient documentation

## 2017-07-18 DIAGNOSIS — C61 Malignant neoplasm of prostate: Secondary | ICD-10-CM | POA: Diagnosis not present

## 2017-07-18 DIAGNOSIS — M549 Dorsalgia, unspecified: Secondary | ICD-10-CM | POA: Insufficient documentation

## 2017-07-18 DIAGNOSIS — Z87891 Personal history of nicotine dependence: Secondary | ICD-10-CM | POA: Insufficient documentation

## 2017-07-18 DIAGNOSIS — K4091 Unilateral inguinal hernia, without obstruction or gangrene, recurrent: Secondary | ICD-10-CM | POA: Insufficient documentation

## 2017-07-18 HISTORY — PX: PELVIC LYMPH NODE DISSECTION: SHX6543

## 2017-07-18 HISTORY — PX: ROBOT ASSISTED LAPAROSCOPIC RADICAL PROSTATECTOMY: SHX5141

## 2017-07-18 LAB — HEMOGLOBIN AND HEMATOCRIT, BLOOD
HCT: 45.2 % (ref 39.0–52.0)
Hemoglobin: 15.9 g/dL (ref 13.0–17.0)

## 2017-07-18 SURGERY — PROSTATECTOMY, RADICAL, ROBOT-ASSISTED, LAPAROSCOPIC
Anesthesia: General

## 2017-07-18 MED ORDER — CEFAZOLIN SODIUM-DEXTROSE 2-4 GM/100ML-% IV SOLN
INTRAVENOUS | Status: AC
  Filled 2017-07-18: qty 100

## 2017-07-18 MED ORDER — SODIUM CHLORIDE 0.9 % IJ SOLN
INTRAMUSCULAR | Status: DC | PRN
  Administered 2017-07-18: 20 mL via INTRAVENOUS

## 2017-07-18 MED ORDER — HYDROMORPHONE HCL 1 MG/ML IJ SOLN
0.5000 mg | INTRAMUSCULAR | Status: DC | PRN
  Administered 2017-07-18: 1 mg via INTRAVENOUS

## 2017-07-18 MED ORDER — ROCURONIUM BROMIDE 50 MG/5ML IV SOSY
PREFILLED_SYRINGE | INTRAVENOUS | Status: DC | PRN
  Administered 2017-07-18: 10 mg via INTRAVENOUS
  Administered 2017-07-18: 20 mg via INTRAVENOUS
  Administered 2017-07-18: 50 mg via INTRAVENOUS
  Administered 2017-07-18: 20 mg via INTRAVENOUS

## 2017-07-18 MED ORDER — OXYCODONE HCL 5 MG PO TABS
ORAL_TABLET | ORAL | Status: AC
  Filled 2017-07-18: qty 1

## 2017-07-18 MED ORDER — FENTANYL CITRATE (PF) 100 MCG/2ML IJ SOLN
INTRAMUSCULAR | Status: DC | PRN
  Administered 2017-07-18: 50 ug via INTRAVENOUS
  Administered 2017-07-18: 100 ug via INTRAVENOUS

## 2017-07-18 MED ORDER — DIPHENHYDRAMINE HCL 50 MG/ML IJ SOLN: 12.5000 mg | Freq: Four times a day (QID) | INTRAMUSCULAR | Status: DC | PRN

## 2017-07-18 MED ORDER — PROPOFOL 10 MG/ML IV BOLUS
INTRAVENOUS | Status: DC | PRN
  Administered 2017-07-18: 150 mg via INTRAVENOUS

## 2017-07-18 MED ORDER — ONDANSETRON HCL 4 MG/2ML IJ SOLN
INTRAMUSCULAR | Status: DC | PRN
  Administered 2017-07-18: 4 mg via INTRAVENOUS

## 2017-07-18 MED ORDER — SODIUM CHLORIDE 0.9 % IV BOLUS (SEPSIS)
1000.0000 mL | Freq: Once | INTRAVENOUS | Status: AC
  Administered 2017-07-18: 1000 mL via INTRAVENOUS

## 2017-07-18 MED ORDER — LIDOCAINE 2% (20 MG/ML) 5 ML SYRINGE
INTRAMUSCULAR | Status: DC | PRN
  Administered 2017-07-18: 80 mg via INTRAVENOUS

## 2017-07-18 MED ORDER — BUPIVACAINE LIPOSOME 1.3 % IJ SUSP
20.0000 mL | Freq: Once | INTRAMUSCULAR | Status: DC
  Filled 2017-07-18: qty 20

## 2017-07-18 MED ORDER — HYDROMORPHONE HCL 1 MG/ML IJ SOLN
INTRAMUSCULAR | Status: AC
  Filled 2017-07-18: qty 1

## 2017-07-18 MED ORDER — HYDROMORPHONE HCL 1 MG/ML IJ SOLN
0.2500 mg | INTRAMUSCULAR | Status: DC | PRN
  Administered 2017-07-18 (×4): 0.5 mg via INTRAVENOUS

## 2017-07-18 MED ORDER — HYDROMORPHONE HCL 1 MG/ML IJ SOLN
INTRAMUSCULAR | Status: AC
Start: 2017-07-18 — End: 2017-07-18
  Administered 2017-07-18: 0.5 mg via INTRAVENOUS
  Filled 2017-07-18: qty 2

## 2017-07-18 MED ORDER — EPHEDRINE SULFATE 50 MG/ML IJ SOLN
INTRAMUSCULAR | Status: DC | PRN
  Administered 2017-07-18 (×2): 10 mg via INTRAVENOUS

## 2017-07-18 MED ORDER — LACTATED RINGERS IV SOLN
INTRAVENOUS | Status: DC | PRN
  Administered 2017-07-18 (×2): via INTRAVENOUS

## 2017-07-18 MED ORDER — GLYCOPYRROLATE 0.2 MG/ML IJ SOLN
INTRAMUSCULAR | Status: DC | PRN
  Administered 2017-07-18: 0.2 mg via INTRAVENOUS

## 2017-07-18 MED ORDER — DEXAMETHASONE SODIUM PHOSPHATE 10 MG/ML IJ SOLN
INTRAMUSCULAR | Status: DC | PRN
  Administered 2017-07-18: 10 mg via INTRAVENOUS

## 2017-07-18 MED ORDER — FENTANYL CITRATE (PF) 250 MCG/5ML IJ SOLN
INTRAMUSCULAR | Status: AC
  Filled 2017-07-18: qty 5

## 2017-07-18 MED ORDER — HYDROCODONE-ACETAMINOPHEN 5-325 MG PO TABS: 1.0000 | ORAL_TABLET | Freq: Four times a day (QID) | ORAL | 0 refills | Status: DC | PRN

## 2017-07-18 MED ORDER — ROCURONIUM BROMIDE 50 MG/5ML IV SOSY
PREFILLED_SYRINGE | INTRAVENOUS | Status: AC
  Filled 2017-07-18: qty 5

## 2017-07-18 MED ORDER — OXYCODONE HCL 5 MG PO TABS
5.0000 mg | ORAL_TABLET | ORAL | Status: DC | PRN
  Administered 2017-07-18 – 2017-07-19 (×3): 5 mg via ORAL
  Filled 2017-07-18 (×2): qty 1

## 2017-07-18 MED ORDER — PHENYLEPHRINE HCL 10 MG/ML IJ SOLN
INTRAMUSCULAR | Status: DC | PRN
  Administered 2017-07-18: 40 ug via INTRAVENOUS

## 2017-07-18 MED ORDER — ONDANSETRON HCL 4 MG/2ML IJ SOLN
INTRAMUSCULAR | Status: AC
Start: 2017-07-18 — End: ?
  Filled 2017-07-18: qty 2

## 2017-07-18 MED ORDER — SUGAMMADEX SODIUM 200 MG/2ML IV SOLN
INTRAVENOUS | Status: AC
  Filled 2017-07-18: qty 2

## 2017-07-18 MED ORDER — MIDAZOLAM HCL 5 MG/5ML IJ SOLN
INTRAMUSCULAR | Status: DC | PRN
  Administered 2017-07-18: 2 mg via INTRAVENOUS

## 2017-07-18 MED ORDER — BUPIVACAINE LIPOSOME 1.3 % IJ SUSP
INTRAMUSCULAR | Status: DC | PRN
  Administered 2017-07-18: 20 mL

## 2017-07-18 MED ORDER — PROMETHAZINE HCL 25 MG/ML IJ SOLN: 6.2500 mg | INTRAMUSCULAR | Status: DC | PRN

## 2017-07-18 MED ORDER — MIDAZOLAM HCL 2 MG/2ML IJ SOLN
INTRAMUSCULAR | Status: AC
  Filled 2017-07-18: qty 2

## 2017-07-18 MED ORDER — LACTATED RINGERS IR SOLN
Status: DC | PRN
  Administered 2017-07-18: 1000 mL

## 2017-07-18 MED ORDER — SODIUM CHLORIDE 0.9 % IJ SOLN
INTRAMUSCULAR | Status: AC
  Filled 2017-07-18: qty 50

## 2017-07-18 MED ORDER — PROPOFOL 10 MG/ML IV BOLUS
INTRAVENOUS | Status: AC
  Filled 2017-07-18: qty 40

## 2017-07-18 MED ORDER — HYDROMORPHONE HCL 1 MG/ML IJ SOLN
INTRAMUSCULAR | Status: DC | PRN
  Administered 2017-07-18 (×2): 0.5 mg via INTRAVENOUS

## 2017-07-18 MED ORDER — CEFAZOLIN SODIUM-DEXTROSE 2-3 GM-%(50ML) IV SOLR
2.0000 g | Freq: Once | INTRAVENOUS | Status: AC
  Administered 2017-07-18: 2 g via INTRAVENOUS

## 2017-07-18 MED ORDER — SULFAMETHOXAZOLE-TRIMETHOPRIM 800-160 MG PO TABS: 1.0000 | ORAL_TABLET | Freq: Two times a day (BID) | ORAL | 0 refills | Status: DC

## 2017-07-18 MED ORDER — DEXTROSE-NACL 5-0.45 % IV SOLN
INTRAVENOUS | Status: DC
  Administered 2017-07-18: 16:00:00 via INTRAVENOUS

## 2017-07-18 MED ORDER — DIPHENHYDRAMINE HCL 12.5 MG/5ML PO ELIX
12.5000 mg | ORAL_SOLUTION | Freq: Four times a day (QID) | ORAL | Status: DC | PRN
  Filled 2017-07-18: qty 10

## 2017-07-18 MED ORDER — DEXAMETHASONE SODIUM PHOSPHATE 10 MG/ML IJ SOLN
INTRAMUSCULAR | Status: AC
  Filled 2017-07-18: qty 1

## 2017-07-18 MED ORDER — STERILE WATER FOR IRRIGATION IR SOLN
Status: DC | PRN
  Administered 2017-07-18: 1000 mL

## 2017-07-18 MED ORDER — HYDROMORPHONE HCL 2 MG/ML IJ SOLN
INTRAMUSCULAR | Status: AC
  Filled 2017-07-18: qty 1

## 2017-07-18 MED ORDER — ACETAMINOPHEN 500 MG PO TABS
1000.0000 mg | ORAL_TABLET | Freq: Four times a day (QID) | ORAL | Status: AC
  Administered 2017-07-18 (×2): 1000 mg via ORAL
  Filled 2017-07-18 (×4): qty 2

## 2017-07-18 MED ORDER — LACTATED RINGERS IV SOLN: INTRAVENOUS | Status: DC

## 2017-07-18 MED ORDER — ONDANSETRON HCL 4 MG/2ML IJ SOLN
4.0000 mg | INTRAMUSCULAR | Status: DC | PRN
  Administered 2017-07-18: 4 mg via INTRAVENOUS
  Filled 2017-07-18: qty 2

## 2017-07-18 MED ORDER — SUGAMMADEX SODIUM 200 MG/2ML IV SOLN
INTRAVENOUS | Status: DC | PRN
  Administered 2017-07-18: 200 mg via INTRAVENOUS

## 2017-07-18 MED ORDER — ACETAMINOPHEN 10 MG/ML IV SOLN
INTRAVENOUS | Status: AC
  Filled 2017-07-18: qty 100

## 2017-07-18 MED ORDER — LIDOCAINE 2% (20 MG/ML) 5 ML SYRINGE
INTRAMUSCULAR | Status: AC
  Filled 2017-07-18: qty 5

## 2017-07-18 MED ORDER — MEPERIDINE HCL 50 MG/ML IJ SOLN: 6.2500 mg | INTRAMUSCULAR | Status: DC | PRN

## 2017-07-18 SURGICAL SUPPLY — 65 items
ADH SKN CLS APL DERMABOND .7 (GAUZE/BANDAGES/DRESSINGS) ×2
APPLICATOR COTTON TIP 6IN STRL (MISCELLANEOUS) ×4 IMPLANT
CATH FOLEY 2WAY SLVR 18FR 30CC (CATHETERS) ×4 IMPLANT
CATH TIEMANN FOLEY 18FR 5CC (CATHETERS) ×4 IMPLANT
CHLORAPREP W/TINT 26ML (MISCELLANEOUS) ×4 IMPLANT
CLIP VESOLOCK LG 6/CT PURPLE (CLIP) ×8 IMPLANT
CLOTH BEACON ORANGE TIMEOUT ST (SAFETY) ×4 IMPLANT
CONT SPEC 4OZ CLIKSEAL STRL BL (MISCELLANEOUS) ×4 IMPLANT
COVER SURGICAL LIGHT HANDLE (MISCELLANEOUS) ×2 IMPLANT
COVER TIP SHEARS 8 DVNC (MISCELLANEOUS) ×2 IMPLANT
COVER TIP SHEARS 8MM DA VINCI (MISCELLANEOUS) ×2
CUTTER ECHEON FLEX ENDO 45 340 (ENDOMECHANICALS) ×4 IMPLANT
DECANTER SPIKE VIAL GLASS SM (MISCELLANEOUS) ×4 IMPLANT
DERMABOND ADVANCED (GAUZE/BANDAGES/DRESSINGS) ×2
DERMABOND ADVANCED .7 DNX12 (GAUZE/BANDAGES/DRESSINGS) ×2 IMPLANT
DRAPE ARM DVNC X/XI (DISPOSABLE) ×8 IMPLANT
DRAPE COLUMN DVNC XI (DISPOSABLE) ×2 IMPLANT
DRAPE DA VINCI XI ARM (DISPOSABLE) ×8
DRAPE DA VINCI XI COLUMN (DISPOSABLE) ×2
DRAPE SURG IRRIG POUCH 19X23 (DRAPES) ×4 IMPLANT
DRSG TEGADERM 4X4.75 (GAUZE/BANDAGES/DRESSINGS) ×4 IMPLANT
ELECT REM PT RETURN 15FT ADLT (MISCELLANEOUS) ×4 IMPLANT
GAUZE SPONGE 2X2 8PLY STRL LF (GAUZE/BANDAGES/DRESSINGS) ×2 IMPLANT
GLOVE BIO SURGEON STRL SZ 6.5 (GLOVE) ×3 IMPLANT
GLOVE BIO SURGEON STRL SZ7.5 (GLOVE) ×4 IMPLANT
GLOVE BIO SURGEONS STRL SZ 6.5 (GLOVE) ×1
GLOVE BIOGEL M STRL SZ7.5 (GLOVE) ×8 IMPLANT
GLOVE BIOGEL PI IND STRL 7.5 (GLOVE) ×2 IMPLANT
GLOVE BIOGEL PI INDICATOR 7.5 (GLOVE) ×4
GOWN STRL REUS W/TWL LRG LVL3 (GOWN DISPOSABLE) ×14 IMPLANT
HOLDER FOLEY CATH W/STRAP (MISCELLANEOUS) ×4 IMPLANT
IRRIG SUCT STRYKERFLOW 2 WTIP (MISCELLANEOUS) ×4
IRRIGATION SUCT STRKRFLW 2 WTP (MISCELLANEOUS) ×2 IMPLANT
KIT PROCEDURE DA VINCI SI (MISCELLANEOUS) ×2
KIT PROCEDURE DVNC SI (MISCELLANEOUS) ×2 IMPLANT
NDL INSUFFLATION 14GA 120MM (NEEDLE) ×2 IMPLANT
NDL SPNL 22GX7 QUINCKE BK (NEEDLE) ×2 IMPLANT
NEEDLE INSUFFLATION 14GA 120MM (NEEDLE) ×4 IMPLANT
NEEDLE SPNL 22GX7 QUINCKE BK (NEEDLE) ×4 IMPLANT
PACK ROBOT UROLOGY CUSTOM (CUSTOM PROCEDURE TRAY) ×4 IMPLANT
PAD POSITIONING PINK XL (MISCELLANEOUS) IMPLANT
PORT ACCESS TROCAR AIRSEAL 12 (TROCAR) ×2 IMPLANT
PORT ACCESS TROCAR AIRSEAL 5M (TROCAR) ×2
RELOAD STAPLE 45 4.1 GRN THCK (STAPLE) ×2 IMPLANT
SEAL CANN UNIV 5-8 DVNC XI (MISCELLANEOUS) ×8 IMPLANT
SEAL XI 5MM-8MM UNIVERSAL (MISCELLANEOUS) ×8
SET TRI-LUMEN FLTR TB AIRSEAL (TUBING) ×4 IMPLANT
SOLUTION ELECTROLUBE (MISCELLANEOUS) ×4 IMPLANT
SPONGE GAUZE 2X2 STER 10/PKG (GAUZE/BANDAGES/DRESSINGS) ×2
SPONGE LAP 4X18 X RAY DECT (DISPOSABLE) ×4 IMPLANT
STAPLE RELOAD 45 GRN (STAPLE) ×2 IMPLANT
STAPLE RELOAD 45MM GREEN (STAPLE) ×4
SUT ETHIBOND 0 (SUTURE) ×4 IMPLANT
SUT ETHILON 3 0 PS 1 (SUTURE) ×4 IMPLANT
SUT MNCRL AB 4-0 PS2 18 (SUTURE) ×8 IMPLANT
SUT PDS AB 1 CT1 27 (SUTURE) ×8 IMPLANT
SUT VIC AB 2-0 SH 27 (SUTURE) ×4
SUT VIC AB 2-0 SH 27X BRD (SUTURE) ×2 IMPLANT
SUT VICRYL 0 UR6 27IN ABS (SUTURE) ×4 IMPLANT
SUT VLOC BARB 180 ABS3/0GR12 (SUTURE) ×12
SUTURE VLOC BRB 180 ABS3/0GR12 (SUTURE) ×6 IMPLANT
SYR 27GX1/2 1ML LL SAFETY (SYRINGE) ×4 IMPLANT
TOWEL OR 17X26 10 PK STRL BLUE (TOWEL DISPOSABLE) ×4 IMPLANT
TOWEL OR NON WOVEN STRL DISP B (DISPOSABLE) ×4 IMPLANT
WATER STERILE IRR 1000ML POUR (IV SOLUTION) ×8 IMPLANT

## 2017-07-18 NOTE — Anesthesia Procedure Notes (Signed)
Procedure Name: Intubation Date/Time: 07/18/2017 8:24 AM Performed by: West Pugh, CRNA Pre-anesthesia Checklist: Patient identified, Emergency Drugs available, Suction available, Patient being monitored and Timeout performed Patient Re-evaluated:Patient Re-evaluated prior to induction Oxygen Delivery Method: Circle system utilized Preoxygenation: Pre-oxygenation with 100% oxygen Induction Type: IV induction and Cricoid Pressure applied Ventilation: Mask ventilation without difficulty and Oral airway inserted - appropriate to patient size Laryngoscope Size: Mac and 4 Grade View: Grade I Tube type: Oral Tube size: 7.5 mm Number of attempts: 1 Airway Equipment and Method: Stylet Placement Confirmation: ETT inserted through vocal cords under direct vision,  positive ETCO2,  CO2 detector and breath sounds checked- equal and bilateral Secured at: 22 cm Tube secured with: Tape Dental Injury: Teeth and Oropharynx as per pre-operative assessment

## 2017-07-18 NOTE — H&P (Signed)
Ryan Wilson is an 56 y.o. male.    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 TRUS 3m with slight median lobe.  Today "[name] is seen to proceed with robotic prostatectomy.   Past Medical History:  Diagnosis Date  . Back pain   . Heart murmur    PER PATIENT WAS HEARD BY A NURSE; DENIES SYMPTOMS    . Inguinal hernia    REPAIRED  IN 1990s  . Prostate cancer ([facility]     Past Surgical History:  Procedure Laterality Date  . HERNIA REPAIR  1995, 1996   INGUINAL HERNIA     History reviewed. No pertinent family history. Social History:  reports that he quit smoking about 15 years ago. His smoking use included cigarettes. He quit after 4.00 years of use. he has never used smokeless tobacco. He reports that he drinks alcohol. He reports that he does not use drugs.  Allergies: No Known Allergies  Medications Prior to Admission  Medication Sig Dispense Refill  . diphenhydrAMINE (BENADRYL) 25 MG tablet Take 25 mg by mouth every 6 (six) hours as needed for allergies (SINUS CONGESTION).      Results for orders placed or performed during the hospital encounter of 07/16/17 (from the past 48 hour(s))  CBC     Status: None   Collection Time: 07/16/17  2:16 PM  Result Value Ref Range   WBC 6.7 4.0 - 10.5 K/uL   RBC 5.03 4.22 - 5.81 MIL/uL   Hemoglobin 16.0 13.0 - 17.0 g/dL   HCT 45.4 39.0 - 52.0 %   MCV 90.3 78.0 - 100.0 fL   MCH 31.8 26.0 - 34.0 pg   MCHC 35.2 30.0 - 36.0 g/dL   RDW 12.3 11.5 - 15.5 %   Platelets 192 150 - 400 K/uL  Basic metabolic panel     Status: Abnormal   Collection Time: 07/16/17  2:16 PM  Result Value Ref Range   Sodium 138 135 - 145 mmol/L   Potassium 3.6 3.5 - 5.1 mmol/L   Chloride 106 101 - 111 mmol/L   CO2 26 22 - 32 mmol/L   Glucose, Bld 137 (H) 65 - 99 mg/dL   BUN 15 6 - 20 mg/dL   Creatinine, Ser 0.83 0.61 - 1.24  mg/dL   Calcium 9.0 8.9 - 10.3 mg/dL   GFR calc non Af Amer >60 >60 mL/min   GFR calc Af Amer >60 >60 mL/min    Comment: (NOTE) The eGFR has been calculated using the CKD EPI equation. This calculation has not been validated in all clinical situations. eGFR's persistently <60 mL/min signify possible Chronic Kidney Disease.    Anion gap 6 5 - 15   No results found.  Review of Systems  Constitutional: Negative.  Negative for chills and fever.  Eyes: Negative.   Cardiovascular: Negative.   Gastrointestinal: Negative.   Genitourinary: Negative.   Musculoskeletal: Negative.   Skin: Negative.   Neurological: Negative.   Endo/Heme/Allergies: Negative.   Psychiatric/Behavioral: Negative.     Blood pressure 116/79, pulse 86, temperature 98 F (36.7 C), temperature source Oral, resp. rate 18, SpO2 98 %. Physical Exam  Constitutional: He appears well-developed.  HENT:  Head: Normocephalic.  Eyes: Pupils are equal, round, and reactive to light.  Neck: Normal range of motion.  Cardiovascular: Normal rate.  Respiratory: Effort normal.  GI: Soft.  Genitourinary:  Genitourinary Comments: No CVAT.   Musculoskeletal: Normal range of motion.  Neurological: He is alert.  Skin: Skin is warm.  Psychiatric: He has a normal mood and affect.     Assessment/Plan  Proceed as planned with robotic prostatectomy with limited pelvic lymphadenectomy. Risks, benefits, alternatives, expected peri-op course discussed previously and reiterated today.   Alexis Frock, MD 07/18/2017, 7:52 AM

## 2017-07-18 NOTE — Anesthesia Preprocedure Evaluation (Addendum)
Anesthesia Evaluation  Patient identified by MRN, date of birth, ID band Patient awake    Reviewed: Allergy & Precautions, NPO status , Patient's Chart, lab work & pertinent test results  Airway Mallampati: I  TM Distance: >3 FB Neck ROM: Full    Dental  (+) Edentulous Upper, Poor Dentition, Dental Advisory Given   Pulmonary former smoker,    breath sounds clear to auscultation       Cardiovascular negative cardio ROS   Rhythm:Regular Rate:Normal     Neuro/Psych negative neurological ROS  negative psych ROS   GI/Hepatic negative GI ROS, Neg liver ROS,   Endo/Other  negative endocrine ROS  Renal/GU negative Renal ROS  negative genitourinary   Musculoskeletal negative musculoskeletal ROS (+)   Abdominal Normal abdominal exam  (+)   Peds  Hematology negative hematology ROS (+)   Anesthesia Other Findings   Reproductive/Obstetrics negative OB ROS                            Anesthesia Physical Anesthesia Plan  ASA: II  Anesthesia Plan: General   Post-op Pain Management:    Induction: Intravenous  PONV Risk Score and Plan: 3 and Ondansetron, Dexamethasone and Midazolam  Airway Management Planned: Oral ETT  Additional Equipment: None  Intra-op Plan:   Post-operative Plan: Extubation in OR  Informed Consent: I have reviewed the patients History and Physical, chart, labs and discussed the procedure including the risks, benefits and alternatives for the proposed anesthesia with the patient or authorized representative who has indicated his/her understanding and acceptance.   Dental advisory given  Plan Discussed with: CRNA  Anesthesia Plan Comments:         Anesthesia Quick Evaluation

## 2017-07-18 NOTE — Brief Op Note (Signed)
07/18/2017  10:47 AM  PATIENT:  Ryan Wilson  56 y.o. male  PRE-OPERATIVE DIAGNOSIS:  PROSTATE CANCER  POST-OPERATIVE DIAGNOSIS:  PROSTATE CANCER, RIGHT INGUINAL HERNIA  PROCEDURE:  Procedure(s): XI ROBOTIC ASSISTED LAPAROSCOPIC RADICAL PROSTATECTOMY, RIGHT INGUINAL HERNIA REPAIR (N/A) LIMITED PELVIC LYMPH NODE DISSECTION (Bilateral)  SURGEON:  Surgeon(s) and Role:    * Alexis Frock, MD - Primary  PHYSICIAN ASSISTANT:   ASSISTANTS: Debbrah Alar PA   ANESTHESIA:   local and general  EBL:  200 mL   BLOOD ADMINISTERED:none  DRAINS: 1 - Foley to gravity, 2 - JP to bulb suction   LOCAL MEDICATIONS USED:  MARCAINE     SPECIMEN:  Source of Specimen:  1 -bilateral pelvic lymph nodes, 2 - periprostatic fat, 3 - radical prostatectomy  DISPOSITION OF SPECIMEN:  PATHOLOGY  COUNTS:  YES  TOURNIQUET:  * No tourniquets in log *  DICTATION: .Other Dictation: Dictation Number [id number]  PLAN OF CARE: Admit for overnight observation  PATIENT DISPOSITION:  PACU - hemodynamically stable.   Delay start of Pharmacological VTE agent (>24hrs) due to surgical blood loss or risk of bleeding: yes

## 2017-07-18 NOTE — Plan of Care (Signed)
  Clinical Measurements: Ability to maintain clinical measurements within normal limits will improve 07/18/2017 2101 - Progressing by Ashley Murrain, RN  Education: Knowledge of General Education information will improve 07/18/2017 2101 - Progressing by Domnique Vanegas, Sherryll Burger, RN

## 2017-07-18 NOTE — Anesthesia Postprocedure Evaluation (Signed)
Anesthesia Post Note  Patient: Scientist, product/process development  Procedure(s) Performed: XI ROBOTIC ASSISTED LAPAROSCOPIC RADICAL PROSTATECTOMY, RIGHT INGUINAL HERNIA REPAIR (N/A ) LIMITED PELVIC LYMPH NODE DISSECTION (Bilateral )     Patient location during evaluation: PACU Anesthesia Type: General Level of consciousness: awake and alert and oriented Pain management: pain level controlled Vital Signs Assessment: post-procedure vital signs reviewed and stable Respiratory status: spontaneous breathing, nonlabored ventilation, respiratory function stable and patient connected to nasal cannula oxygen Cardiovascular status: blood pressure returned to baseline and stable Postop Assessment: no apparent nausea or vomiting Anesthetic complications: no    Last Vitals:  Vitals:   07/18/17 1130 07/18/17 1145  BP: 130/87 126/88  Pulse: 90 89  Resp: 14 12  Temp:  36.7 C  SpO2: 96% 97%    Last Pain:  Vitals:   07/18/17 1200  TempSrc:   PainSc: 4                  Joene Gelder A.

## 2017-07-18 NOTE — Progress Notes (Signed)
Assumed care

## 2017-07-18 NOTE — Transfer of Care (Signed)
Immediate Anesthesia Transfer of Care Note  Patient: Ryan Wilson  Procedure(s) Performed: XI ROBOTIC ASSISTED LAPAROSCOPIC RADICAL PROSTATECTOMY, RIGHT INGUINAL HERNIA REPAIR (N/A ) LIMITED PELVIC LYMPH NODE DISSECTION (Bilateral )  Patient Location: PACU  Anesthesia Type:General  Level of Consciousness: alert , drowsy and patient cooperative  Airway & Oxygen Therapy: Patient Spontanous Breathing and Patient connected to face mask oxygen  Post-op Assessment: Report given to RN, Post -op Vital signs reviewed and stable and Patient moving all extremities X 4  Post vital signs: Reviewed and stable  Last Vitals:  Vitals:   07/18/17 0534  BP: 116/79  Pulse: 86  Resp: 18  Temp: 36.7 C  SpO2: 98%    Last Pain:  Vitals:   07/18/17 0534  TempSrc: Oral         Complications: No apparent anesthesia complications

## 2017-07-18 NOTE — Discharge Instructions (Signed)

## 2017-07-19 DIAGNOSIS — C61 Malignant neoplasm of prostate: Secondary | ICD-10-CM | POA: Diagnosis not present

## 2017-07-19 LAB — HEMOGLOBIN AND HEMATOCRIT, BLOOD
HCT: 40.1 % (ref 39.0–52.0)
HEMOGLOBIN: 13.9 g/dL (ref 13.0–17.0)

## 2017-07-19 LAB — BASIC METABOLIC PANEL
ANION GAP: 7 (ref 5–15)
BUN: 10 mg/dL (ref 6–20)
CHLORIDE: 101 mmol/L (ref 101–111)
CO2: 28 mmol/L (ref 22–32)
Calcium: 8.3 mg/dL — ABNORMAL LOW (ref 8.9–10.3)
Creatinine, Ser: 0.8 mg/dL (ref 0.61–1.24)
GFR calc Af Amer: >60 mL/min (ref >60)
GLUCOSE: 120 mg/dL — AB (ref 65–99)
POTASSIUM: 3.6 mmol/L (ref 3.5–5.1)
Sodium: 136 mmol/L (ref 135–145)

## 2017-07-19 LAB — GLUCOSE, CAPILLARY: GLUCOSE-CAPILLARY: 142 mg/dL — AB (ref 65–99)

## 2017-07-19 MED ORDER — BISACODYL 10 MG RE SUPP
10.0000 mg | Freq: Once | RECTAL | Status: AC
  Administered 2017-07-19: 10 mg via RECTAL
  Filled 2017-07-19: qty 1

## 2017-07-19 MED ORDER — HYDROCODONE-ACETAMINOPHEN 5-325 MG PO TABS: 1.0000 | ORAL_TABLET | Freq: Four times a day (QID) | ORAL | Status: DC | PRN

## 2017-07-19 NOTE — Progress Notes (Signed)
Patient ID: Ryan Wilson, male   DOB: 09/07/1960, 56 y.o.   MRN: [id number]  1 Day Post-Op Subjective: The patient is doing well.  No nausea or vomiting. Pain is adequately controlled.  Objective: Vital signs in last 24 hours: Temp:  [97.5 F (36.4 C)-98.7 F (37.1 C)] 98.7 F (37.1 C) (12/22 0843) Pulse Rate:  [64-101] 64 (12/22 0843) Resp:  [11-18] 16 (12/22 0500) BP: (96-130)/(67-90) 96/67 (12/22 0843) SpO2:  [94 %-99 %] 97 % (12/22 0843) Weight:  [78.9 kg (174 lb)] 78.9 kg (174 lb) (12/21 1410)  Intake/Output from previous day: 12/21 0701 - 12/22 0700 In: 3821.3 [P.O.:1130; I.V.:2691.3] Out: 3095 [Urine:2700; Drains:195; Blood:200] Intake/Output this shift: No intake/output data recorded.  Physical Exam:  General: Alert and oriented. CV: RRR Lungs: Clear bilaterally. GI: Soft, Nondistended. Incisions: Clean, dry, and intact Urine: Clear Extremities: Nontender, no erythema, no edema.  Lab Results: Recent Labs    07/16/17 1416 07/18/17 1113 07/19/17 0547  HGB 16.0 15.9 13.9  HCT 45.4 45.2 40.1      Assessment/Plan: POD# 1 s/p robotic prostatectomy.  1) SL IVF 2) Ambulate, Incentive spirometry 3) Transition to oral pain medication 4) Dulcolax suppository 5) D/C pelvic drain 6) Plan for likely discharge later today   Pryor Curia. MD   LOS: 0 days   Nilam Quakenbush,LES 07/19/2017, 10:42 AM

## 2017-07-19 NOTE — Discharge Summary (Signed)
Date of admission: 07/18/2017  Date of discharge: 07/19/2017  Admission diagnosis: Prostate Cancer  Discharge diagnosis: Prostate Cancer  History and Physical: For full details, please see admission history and physical. Briefly, Ryan Wilson is a 56 y.o. gentleman with localized prostate cancer.  After discussing management/treatment options, he elected to proceed with surgical treatment.  Hospital Course: Ryan Wilson was taken to the operating room on 07/18/2017 and underwent a robotic assisted laparoscopic radical prostatectomy. He tolerated this procedure well and without complications. Postoperatively, he was able to be transferred to a regular hospital room following recovery from anesthesia.  He was able to begin ambulating the night of surgery. He remained hemodynamically stable overnight.  He had excellent urine output with appropriately minimal output from his pelvic drain and his pelvic drain was removed on POD #1.  He was transitioned to oral pain medication, tolerated a clear liquid diet, and had met all discharge criteria and was able to be discharged home later on POD#1.  Laboratory values:  Recent Labs    07/16/17 1416 07/18/17 1113 07/19/17 0547  HGB 16.0 15.9 13.9  HCT 45.4 45.2 40.1    Disposition: Home  Discharge instruction: He was instructed to be ambulatory but to refrain from heavy lifting, strenuous activity, or driving. He was instructed on urethral catheter care.  Discharge medications:   Allergies as of 07/19/2017   No Known Allergies     Medication List    TAKE these medications   diphenhydrAMINE 25 MG tablet Commonly known as:  BENADRYL Take 25 mg by mouth every 6 (six) hours as needed for allergies (SINUS CONGESTION).   HYDROcodone-acetaminophen 5-325 MG tablet Commonly known as:  NORCO Take 1-2 tablets by mouth every 6 (six) hours as needed for moderate pain or severe pain.   sulfamethoxazole-trimethoprim 800-160 MG tablet Commonly known as:   BACTRIM DS,SEPTRA DS Take 1 tablet by mouth 2 (two) times daily. Start the day prior to foley removal appointment       Followup: He will followup in 1 week for catheter removal and to discuss his surgical pathology results.

## 2017-07-19 NOTE — Op Note (Signed)
Ryan Wilson, SCULL NO.:  [id number]  MEDICAL RECORD NO.:  [id number]  LOCATION:  [facility]                         FACILITY:  [facility]  PHYSICIAN:  Alexis Frock, MD     DATE OF BIRTH:  Dec 10, 1960  DATE OF PROCEDURE: 07/18/2017                               OPERATIVE REPORT   DIAGNOSIS:  Significant volume low-risk prostate cancer.  PROCEDURES: 1. Robotic-assisted laparoscopic radical prostatectomy. 2. Bilateral pelvic lymphadenectomy. 3. Right laparoscopic inguinal hernia repair.  ESTIMATED BLOOD LOSS:  150 mL.  COMPLICATION:  None.  SPECIMENS: 1. Right external iliac lymph nodes. 2. Right obturator lymph nodes. 3. Left external iliac lymph nodes. 4. Left obturator lymph nodes. 5. Periprostatic fat. 6. Radical prostatectomy.  All for permanent pathology.  ASSISTANT:  Debbrah Alar, PA.  FINDINGS: 1. Significant direct right inguinal hernia with approximately 2 cm     fascial defect of the internal ring after developing space of     Retzius. 2. Resolution of fascial defect in the internal ring following right     inguinal hernia repair.  INDICATIONS:  Mr. 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  He is quite young and healthy.  He does have history of bilateral inguinal hernia repair in the past for symptomatic hernias.  Options were discussed for definitive management including surveillance protocols versus ablative therapies versus surgical extirpation, and he wished to proceed with robotic prostatectomy with curative intent.  Informed consent was obtained and placed in the medical record.  PROCEDURE IN DETAIL:  The patient being Ryan Wilson, was verified. Procedure being robotic prostatectomy was confirmed.  Procedure was carried out.  Time-out was performed.  Intravenous antibiotics were administered.  General endotracheal  anesthesia was introduced.  The patient was placed into a low lithotomy position.  Sterile field was created by prepping and draping the patient's penis, perineum and proximal thighs using iodine and his infra-xiphoid abdomen using chlorhexidine gluconate after he was further fashioned to the operative table using 3-inch tape over foam padding across his supraxiphoid chest. A test of steep Trendelenburg positioning was performed and was found to be suitably positioned.  Next, a high-flow, low-pressure pneumoperitoneum was obtained using Veress technique after Foley catheter was placed per urethra to straight drain.  This performed in the supraumbilical midline after passing the aspiration and drop test. Next, an 8-mm robotic camera port was placed into the same location. Laparoscopic examination of the peritoneal cavity revealed no significant adhesions and no visceral injury.  Additional ports were then placed as follows:  Right paramedian 8-mm robotic port, right far lateral 12-mm AirSeal assistant port, right paramedian 5-mm suction port, left paramedian 8-mm robotic port, left far lateral 8-mm robotic port.  Robot was docked and passed through the electronic checks. Initial attention was directed at development of space of Retzius. Incision was made lateral to the left medial umbilical ligament from the midline towards the area of the internal ring coursing along the iliac vessels towards the area of the ureter.  The left vas deferens was purposely ligated and used as a medial  bucket-handle and the left bladder was swept away from the pelvis towards the area of the endopelvic fascia on the left side.  A mirror-imaged dissection was performed on the right side, purposely transecting the right vas deferens and freeing up the right bladder away from the pelvic sidewall. Anterior attachments were taken down using cautery scissors.  Having developed space of Retzius and allowing  visualization of pubic rami, there was immediately noted to be approximately 2 cm fascial defect, direct hernia-type fashion with fat in this on the right side.  Given the patient's history of symptomatic inguinal hernias and the fact that now his space of Retzius was now obliterated, it was felt that hernia repair would be clearly warranted on the right side to prevent herniation of bowel.  The anterior base prostate-bladder neck junction was identified.  This area was defatted, this was set aside, labeled as periprostatic fat.  Next, the endopelvic fascia was carefully swept away from the prostate in a base-to-apex orientation bilaterally, this exposed the dorsal venous complex, which was carefully controlled using vascular load stapler, taken exquisite care to avoid membranous urethral injury, which did not occur.  Next, attention was directed at lymphadenectomy first on the right side, all fibrofatty tissue and confines of right external iliac artery, vein, pelvic side wall, iliac bifurcation was dissected free.  Lymphostasis was achieved with cold clips, labelled right external iliac lymph nodes.  Next, fibrofatty tissue and confines of the right external iliac vein, pelvic sidewall, obturator nerve was dissected free.  Lymphostasis was achieved with cold clips, set aside, labeled the right obturator lymph nodes.  The obturator nerve was inspected following these maneuvers and found to be visibly uninjured.  Similarly, a mirror-imaged left pelvic lymphadenectomy was performed of the left external iliac and left obturator groups respectively, and the left obturator nerve was also inspected following these maneuvers and found to be uninjured. Attention was directed at bladder-neck dissection.  The bladder neck was identified by moving the Foley catheter back and forth, and a lateral release was performed to better define the bladder-neck prostate junction and final bladder incision was  performed of the anterior plane keeping what appeared to be a rim of circular muscle fibers with both the prostate and the bladder dissections, and this was achieved. Posterior dissection was performed by incising approximately 7 mm inferior-posterior to the posterior lip of the prostate entering the plane of Denonvilliers.  The patient had a very small early median lobe, this did not require any substantial maneuvers.  Bilateral vasa deferentia were encountered, dissected for distance approximately 4 cm, placed on gentle lateral traction.  Bilateral seminal vesicles were dissected to their tips and placed on gentle superior traction. Dissection proceeded within this plane towards the area of the apex of the prostate.  This exposed the vascular pedicles on each side.  The pedicles were controlled using sequential clipping technique in a base- to-apex orientation, performing aggressive nerve sparing on the left side and partial nerve sparing on the right given the patient's right- sided cancer.  Apical dissection was then performed of the anterior plane coldly transecting the membranous urethra, keeping what appeared to be an adequate membranous urethral stump.  This completely freed up the prostatectomy specimen, which was placed in EndoCatch bag for later retrieval.  Next, digital rectal exam was performed using indicator glove under laparoscopic vision.  No evidence of rectal violation was noted.  Posterior reconstruction was performed using a single 3-0 V-Loc suture reapproximating the posterior urethral plate  to the posterior bladder neck tissue bringing these structures into tension-free apposition.  Next, mucosa-to-mucosa anastomosis was performed using a double-armed V-Loc suture reapproximating the two structures.  A new Foley catheter was then placed, which irrigated quantitatively.  All sponge and needle counts were correct.  Attention was directed at right inguinal hernia  repair.  Already, Ethibond suture and a small curved needle were used to place two figure-of-eight sutures within the right internal ring fashion, which completely obliterated the hernia defect. Photodocumentation performed pre and post hernia repair.  A closed suction drain was then brought through the previous left lateral most robotic port site near the peritoneal cavity.  Robot was then undocked. Specimen was retrieved by extending the previous camera port site inferiorly for distance approximately 4 cm removing the prostatectomy specimen, setting aside for permanent pathology.  This site was closed at the level of the fascia using figure-of-eight PDS x4 followed by reapproximation of Scarpa's with running Vicryl.  All incision sites were infiltrated with dilute lyophilized Marcaine and closed at the level of the skin using subcuticular Monocryl followed by Dermabond. Procedure was then terminated.  The patient tolerated the procedure well.  There were no immediate periprocedural complications.  The patient was taken to the postanesthesia care unit in stable condition.  Please note, first assistant, Debbrah Alar, was absolutely crucial for all robotic portions of the procedure today.  She provided invaluable vascular clipping, retraction, suctioning, vascular pedicle stapling; without which, this would not be possible.          ______________________________ Alexis Frock, MD     TM/MEDQ  D:  07/18/2017  T:  07/19/2017  Job:  [id number]

## 2018-02-10 NOTE — Progress Notes (Signed)
Radiation Oncology         [phone] ________________________________  Initial outpatient Consultation  Name: Ryan Wilson MRN: [id number]  Date: 02/12/2018  DOB: 06-22-1961  CC:[facility], [facility]  Ryan Frock, MD   REFERRING PHYSICIAN: Alexis Frock, MD  DIAGNOSIS: 57 y.o. gentleman with detectable PSA of 0.14 s/p RALP for stage pT3aN0M0 adenocarcinoma of the prostate with Gleason Score of 3+4    ICD-10-CM   1. Prostate cancer ([place]) C61     HISTORY OF PRESENT ILLNESS: Ryan Wilson is a 57 y.o. male with a diagnosis of prostate cancer. In 10/2014, the patient had elevated PSA of 11.3 and was referred to [facility] for evaluation but did not keep his appointment. He was referred back to [facility] in 01/2017, to Ryan Frock, MD who performed a digital rectal examination revealing no nodularity. The patient proceeded to transrectal ultrasound with 12 biopsies of the prostate on 02/10/2017. Out of 12 core biopsies,3 were positive. The maximal Gleason score was 3+3 and this was seen in the right mid, right mid lateral and right apex lateral.  He elected to proceed with a radical laparoscopic robot assisted prostatectomy with limited BPLND on 07/18/2017. Final surgical pathology showed pT3aN0Mx prostatic adenocarcinoma, gleason 3+4=7 with extraprostatic extension present but negative margins and no SVI.  His initial post op PSA was 0.080 on 10/13/17 and most recent PSA was 0.14 on 01/13/18. He has regained full bladder control since surgery and does not require any pads at this point.  The patient reviewed the surgical pathology and post treatment PSA values with his urologist and he has kindly been referred today for discussion of potential adjuvant radiation treatment options. He has a family history of prostate cancer in his brother.  Ryan Wilson is accompanied today by his wife. He endorses an occasional quick "zing" of pain to his heart/ chest area that comes  and goes so quickly that he does not need to take anything for the pain. Since surgery he reports nocturia x 2, frequency, and urgency but denies straining to void, incomplete bladder emptying or incontinence.  He feels that he empties his bladder well on voiding and has a strong, steady stream. He denies constipation, diarrhea, dysuria, hematuria, and lower extremity edema.   PREVIOUS RADIATION THERAPY: No  PAST MEDICAL HISTORY:  Past Medical History:  Diagnosis Date  . Back pain   . Heart murmur    PER PATIENT WAS HEARD BY A NURSE; DENIES SYMPTOMS    . Inguinal hernia    REPAIRED  IN 1990s  . Prostate cancer ([place])       PAST SURGICAL HISTORY: Past Surgical History:  Procedure Laterality Date  . HERNIA REPAIR  1995, 1996   INGUINAL HERNIA   . PELVIC LYMPH NODE DISSECTION Bilateral 07/18/2017   Procedure: LIMITED PELVIC LYMPH NODE DISSECTION;  Surgeon: Ryan Frock, MD;  Location: [facility];  Service: Urology;  Laterality: Bilateral;  . ROBOT ASSISTED LAPAROSCOPIC RADICAL PROSTATECTOMY N/A 07/18/2017   Procedure: XI ROBOTIC ASSISTED LAPAROSCOPIC RADICAL PROSTATECTOMY, RIGHT INGUINAL HERNIA REPAIR;  Surgeon: Ryan Frock, MD;  Location: [facility];  Service: Urology;  Laterality: N/A;    FAMILY HISTORY:  Family History  Problem Relation Age of Onset  . Prostate cancer Brother     SOCIAL HISTORY:  Social History   Socioeconomic History  . Marital status: Married    Spouse name: Not on file  . Number of children: Not on file  . Years of education: Not on  file  . Highest education level: Not on file  Occupational History  . Not on file  Social Needs  . Financial resource strain: Not on file  . Food insecurity:    Worry: Not on file    Inability: Not on file  . Transportation needs:    Medical: Not on file    Non-medical: Not on file  Tobacco Use  . Smoking status: Former Smoker    Packs/day: 0.50    Years: 4.00    Pack years: 2.00    Types: Cigarettes    Last  attempt to quit: 07/16/2002    Years since quitting: 15.5  . Smokeless tobacco: Never Used  Substance and Sexual Activity  . Alcohol use: Yes    Comment: social drinker  . Drug use: No  . Sexual activity: Not Currently  Lifestyle  . Physical activity:    Days per week: Not on file    Minutes per session: Not on file  . Stress: Not on file  Relationships  . Social connections:    Talks on phone: Not on file    Gets together: Not on file    Attends religious service: Not on file    Active member of club or organization: Not on file    Attends meetings of clubs or organizations: Not on file    Relationship status: Not on file  . Intimate partner violence:    Fear of current or ex partner: Not on file    Emotionally abused: Not on file    Physically abused: Not on file    Forced sexual activity: Not on file  Other Topics Concern  . Not on file  Social History Narrative  . Not on file    ALLERGIES: Patient has no known allergies.  MEDICATIONS:  Current Outpatient Medications  Medication Sig Dispense Refill  . naproxen (NAPROSYN) 500 MG tablet naproxen 500 mg tablet  TK 1 T PO  BID    . omeprazole (PRILOSEC) 40 MG capsule omeprazole 40 mg capsule,delayed release  TK 1 C PO D     No current facility-administered medications for this encounter.     REVIEW OF SYSTEMS:  On review of systems, the patient reports that he is doing well overall. He denies any chest pain, shortness of breath, cough, fevers, chills, night sweats, unintended weight changes. He denies any bowel disturbances, and denies abdominal pain, nausea or vomiting. He denies any new musculoskeletal or joint aches or pains. His IPSS was 14, indicating moderate urinary symptoms. He is able to complete sexual activity with most attempts. A complete review of systems is obtained and is otherwise negative.    PHYSICAL EXAM:  Wt Readings from Last 3 Encounters:  02/12/18 174 lb 6.4 oz (79.1 kg)  07/18/17 174 lb (78.9  kg)  07/16/17 174 lb (78.9 kg)   Temp Readings from Last 3 Encounters:  02/12/18 98.7 F (37.1 C)  07/19/17 98.3 F (36.8 C) (Oral)  07/16/17 98.1 F (36.7 C) (Oral)   BP Readings from Last 3 Encounters:  02/12/18 (!) 118/91  07/19/17 115/87  07/16/17 124/76   Pulse Readings from Last 3 Encounters:  02/12/18 82  07/19/17 78  07/16/17 87   Pain Assessment Pain Score: 0-No pain/10  In general this is a well appearing african [place] male in no acute distress. He is alert and oriented x4 and appropriate throughout the examination. HEENT reveals that the patient is normocephalic, atraumatic. EOMs are intact. PERRLA. Skin is intact without  any evidence of gross lesions. Cardiovascular exam reveals a regular rate and rhythm, no clicks rubs or murmurs are auscultated. Chest is clear to auscultation bilaterally. Lymphatic assessment is performed and does not reveal any adenopathy in the cervical, supraclavicular, axillary, or inguinal chains. Abdomen has active bowel sounds in all quadrants and is intact. The abdomen is soft, non tender, non distended. Lower extremities are negative for pretibial pitting edema, deep calf tenderness, cyanosis or clubbing.   KPS = 100  100 - Normal; no complaints; no evidence of disease. 90   - Able to carry on normal activity; minor signs or symptoms of disease. 80   - Normal activity with effort; some signs or symptoms of disease. 72   - Cares for self; unable to carry on normal activity or to do active work. 60   - Requires occasional assistance, but is able to care for most of his personal needs. 50   - Requires considerable assistance and frequent medical care. 20   - Disabled; requires special care and assistance. 5   - Severely disabled; hospital admission is indicated although death not imminent. 50   - Very sick; hospital admission necessary; active supportive treatment necessary. 10   - Moribund; fatal processes progressing rapidly. 0     -  Dead  Karnofsky DA, Abelmann [place], Craver LS and Burchenal [facility] [phone]) The use of the nitrogen mustards in the palliative treatment of carcinoma: with particular reference to bronchogenic carcinoma Cancer 1 [phone]  LABORATORY DATA:  Lab Results  Component Value Date   WBC 6.7 07/16/2017   HGB 13.9 07/19/2017   HCT 40.1 07/19/2017   MCV 90.3 07/16/2017   PLT 192 07/16/2017   Lab Results  Component Value Date   NA 136 07/19/2017   K 3.6 07/19/2017   CL 101 07/19/2017   CO2 28 07/19/2017   No results found for: ALT, AST, GGT, ALKPHOS, BILITOT   RADIOGRAPHY: No results found.    IMPRESSION/PLAN: 1. 57 y.o. gentleman with detectable PSA of 0.14 s/p RALP for stage pT3aN0M0 adenocarcinoma of the prostate with Gleason Score of 3+4, and PSA of 11.3.  Today we reviewed the findings and workup thus far.  We discussed the natural history of prostate cancer.  We reviewed the implications of positive margins, extracapsular extension, and seminal vesicle involvement as well as detectable post op PSA.  We discussed radiation treatment directed to the prostatic fossa with regard to the logistics and delivery of external beam radiation treatment.  The recommendation is for a 7.5 week course of daily radiation therapy to the prostate fossa.  We reviewed the anticipated short and long term side effects as well as risks and benefits of treatment.  He was encouraged to ask questions that were answered to his stated satisfaction.   Conversation, the patient elects to proceed with a 7-1/2-week course of adjuvant radiotherapy to the prostate fossa as recommended.  We will share our findings with Dr. Tresa Moore and he will be scheduled for CT simulation in the near future.  We enjoyed meeting with him today, and will look forward to participating in the care of this very nice gentleman.   We spent 60 minutes face to face with the patient and more than 50% of that time was spent in counseling and/or coordination of  care.   ------------------------------------------------  Nicholos Johns, PA-C    Tyler Pita, MD  [place]: [phone]  Fax: [phone] [email or web address]  Skype  [email or web address]  This document serves as a record of services personally performed by Tyler Pita, MD and Nicholos Johns, PA-C. It was created on their behalf by Margit Banda, a trained medical scribe. The creation of this record is based on the scribe's personal observations and the provider's statements to them. This document has been checked and approved by the attending provider.

## 2018-02-11 ENCOUNTER — Encounter: Payer: Self-pay | Admitting: Radiation Oncology

## 2018-02-11 NOTE — Progress Notes (Signed)
GU Location of Tumor / Histology: High risk prostate cancer s/p robotic prostatectomy done 06/2017.  If Prostate Cancer, Gleason Score is (3 + 4) and PSA is (11.3) pre-op.   Post op PSA: 12/2017  PSA 0.14 09/2017  PSA 0.08   Ryan Wilson had an elevated PSA of 11.3 on screening labs done by PCP 10/2014. Patient's older brother and younger brother treated for prostate ca with surgery. He was referred to [facility] in 2016 gut didn't keep his appointment.    Past/Anticipated interventions by urology, if any: prostate biopsy, prostatectomy, referral to PT, post op PSA, referral to radiation therapy for consideration of salvage XRT  Past/Anticipated interventions by medical oncology, if any: no  Weight changes, if any: no  Bowel/Bladder complaints, if any: He reports significant urinary symptoms including incomplete emptying, frequency, intermittency, urgency, weak stream, and straining. He reports nocturia X2 nightly.   Nausea/Vomiting, if any: no  Pain issues, if any:  He reports occasional sharp pains to his left chest area. He tells me that "it comes and goes"  SAFETY ISSUES:  Prior radiation? no  Pacemaker/ICD? no  Possible current pregnancy? no  Is the patient on methotrexate? no  Current Complaints / other details:  57 year old male. Married. 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  BP (!) 118/91   Pulse 82   Temp 98.7 F (37.1 C)   Resp 18   Ht 5\' 11"  (1.803 m)   Wt 174 lb 6.4 oz (79.1 kg)   SpO2 98% Comment: room air  BMI 24.32 kg/m    Wt Readings from Last 3 Encounters:  02/12/18 174 lb 6.4 oz (79.1 kg)  07/18/17 174 lb (78.9 kg)  07/16/17 174 lb (78.9 kg)

## 2018-02-12 ENCOUNTER — Other Ambulatory Visit: Payer: Self-pay

## 2018-02-12 ENCOUNTER — Ambulatory Visit
Admission: RE | Admit: 2018-02-12 | Discharge: 2018-02-12 | Disposition: A | Discharge status: Home / Self Care | Payer: Self-pay | Source: Ambulatory Visit | Attending: Radiation Oncology | Admitting: Radiation Oncology

## 2018-02-12 ENCOUNTER — Encounter: Payer: Self-pay | Admitting: Radiation Oncology

## 2018-02-12 VITALS — BP 118/91 | HR 82 | Temp 98.7°F | Resp 18 | Ht 71.0 in | Wt 174.4 lb

## 2018-02-12 DIAGNOSIS — Z79899 Other long term (current) drug therapy: Secondary | ICD-10-CM | POA: Insufficient documentation

## 2018-02-12 DIAGNOSIS — Z87891 Personal history of nicotine dependence: Secondary | ICD-10-CM | POA: Insufficient documentation

## 2018-02-12 DIAGNOSIS — C61 Malignant neoplasm of prostate: Secondary | ICD-10-CM

## 2018-02-12 DIAGNOSIS — Z8042 Family history of malignant neoplasm of prostate: Secondary | ICD-10-CM | POA: Insufficient documentation

## 2018-02-20 ENCOUNTER — Ambulatory Visit
Admission: RE | Admit: 2018-02-20 | Discharge: 2018-02-20 | Disposition: A | Discharge status: Home / Self Care | Payer: Self-pay | Source: Ambulatory Visit | Attending: Radiation Oncology | Admitting: Radiation Oncology

## 2018-02-20 ENCOUNTER — Encounter: Payer: Self-pay | Admitting: Medical Oncology

## 2018-02-20 DIAGNOSIS — C61 Malignant neoplasm of prostate: Secondary | ICD-10-CM | POA: Insufficient documentation

## 2018-02-20 NOTE — Progress Notes (Signed)
  Radiation Oncology         [phone] ________________________________  Name: Ryan Wilson MRN: [id number]  Date: 02/20/2018  DOB: [birth year]  SIMULATION AND TREATMENT PLANNING NOTE    ICD-10-CM   1. Prostate cancer [name]) C61     DIAGNOSIS:  57 y.o. gentleman with detectable PSA of 0.14 s/p RALP for stage pT3aN0M0 adenocarcinoma of the prostate with Gleason Score of 3+4  NARRATIVE:  The patient was brought to the [place].  Identity was confirmed.  All relevant records and images related to the planned course of therapy were reviewed.  The patient freely provided informed written consent to proceed with treatment after reviewing the details related to the planned course of therapy. The consent form was witnessed and verified by the simulation staff.  Then, the patient was set-up in a stable reproducible supine position for radiation therapy.  A vacuum lock pillow device was custom fabricated to position his legs in a reproducible immobilized position.  Then, I performed a urethrogram under sterile conditions to identify the prostatic apex.  CT images were obtained.  Surface markings were placed.  The CT images were loaded into the planning software.  Then the prostate target and avoidance structures including the rectum, bladder, bowel and hips were contoured.  Treatment planning then occurred.  The radiation prescription was entered and confirmed.  A total of one complex treatment devices was fabricated. I have requested : Intensity Modulated Radiotherapy (IMRT) is medically necessary for this case for the following reason:  Rectal sparing.Marland Kitchen  PLAN:  The patient will receive 68.4 Gy in 38 fractions.  ________________________________  Sheral Apley Tammi Klippel, M.D.

## 2018-02-20 NOTE — Progress Notes (Signed)
Introduced myself to Ryan Wilson and his wife as the prostate nurse navigator and my role. I was unable to meet him 7/18 when he consulted with Dr. Tammi Klippel. He had robotic prostatectomy 06/2017 and now with a rising PSA. He states that he has a strong family history of cancer. He has 2 brothers with prostate cancer.I discussed genetic referral and he will consider. I gave them my business card and asked them to call with questions or concerns.

## 2018-03-02 DIAGNOSIS — Z510 Encounter for antineoplastic radiation therapy: Secondary | ICD-10-CM | POA: Insufficient documentation

## 2018-03-04 ENCOUNTER — Ambulatory Visit
Admission: RE | Admit: 2018-03-04 | Discharge: 2018-03-04 | Disposition: A | Discharge status: Home / Self Care | Payer: Self-pay | Source: Ambulatory Visit | Attending: Radiation Oncology | Admitting: Radiation Oncology

## 2018-03-05 ENCOUNTER — Ambulatory Visit
Admission: RE | Admit: 2018-03-05 | Discharge: 2018-03-05 | Disposition: A | Discharge status: Home / Self Care | Payer: Self-pay | Source: Ambulatory Visit | Attending: Radiation Oncology | Admitting: Radiation Oncology

## 2018-03-06 ENCOUNTER — Ambulatory Visit
Admission: RE | Admit: 2018-03-06 | Discharge: 2018-03-06 | Disposition: A | Discharge status: Home / Self Care | Payer: Self-pay | Source: Ambulatory Visit | Attending: Radiation Oncology | Admitting: Radiation Oncology

## 2018-03-09 ENCOUNTER — Ambulatory Visit
Admission: RE | Admit: 2018-03-09 | Discharge: 2018-03-09 | Disposition: A | Discharge status: Home / Self Care | Payer: Self-pay | Source: Ambulatory Visit | Attending: Radiation Oncology | Admitting: Radiation Oncology

## 2018-03-10 ENCOUNTER — Ambulatory Visit
Admission: RE | Admit: 2018-03-10 | Discharge: 2018-03-10 | Disposition: A | Discharge status: Home / Self Care | Payer: Self-pay | Source: Ambulatory Visit | Attending: Radiation Oncology | Admitting: Radiation Oncology

## 2018-03-11 ENCOUNTER — Ambulatory Visit
Admission: RE | Admit: 2018-03-11 | Discharge: 2018-03-11 | Disposition: A | Discharge status: Home / Self Care | Payer: Self-pay | Source: Ambulatory Visit | Attending: Radiation Oncology | Admitting: Radiation Oncology

## 2018-03-12 ENCOUNTER — Ambulatory Visit
Admission: RE | Admit: 2018-03-12 | Discharge: 2018-03-12 | Disposition: A | Discharge status: Home / Self Care | Payer: Self-pay | Source: Ambulatory Visit | Attending: Radiation Oncology | Admitting: Radiation Oncology

## 2018-03-13 ENCOUNTER — Ambulatory Visit
Admission: RE | Admit: 2018-03-13 | Discharge: 2018-03-13 | Disposition: A | Discharge status: Home / Self Care | Payer: Self-pay | Source: Ambulatory Visit | Attending: Radiation Oncology | Admitting: Radiation Oncology

## 2018-03-16 ENCOUNTER — Ambulatory Visit
Admission: RE | Admit: 2018-03-16 | Discharge: 2018-03-16 | Disposition: A | Discharge status: Home / Self Care | Payer: Self-pay | Source: Ambulatory Visit | Attending: Radiation Oncology | Admitting: Radiation Oncology

## 2018-03-17 ENCOUNTER — Ambulatory Visit
Admission: RE | Admit: 2018-03-17 | Discharge: 2018-03-17 | Disposition: A | Discharge status: Home / Self Care | Payer: Self-pay | Source: Ambulatory Visit | Attending: Radiation Oncology | Admitting: Radiation Oncology

## 2018-03-17 ENCOUNTER — Encounter: Payer: Self-pay | Admitting: Medical Oncology

## 2018-03-18 ENCOUNTER — Ambulatory Visit
Admission: RE | Admit: 2018-03-18 | Discharge: 2018-03-18 | Disposition: A | Discharge status: Home / Self Care | Payer: Self-pay | Source: Ambulatory Visit | Attending: Radiation Oncology | Admitting: Radiation Oncology

## 2018-03-19 ENCOUNTER — Ambulatory Visit
Admission: RE | Admit: 2018-03-19 | Discharge: 2018-03-19 | Disposition: A | Discharge status: Home / Self Care | Payer: Self-pay | Source: Ambulatory Visit | Attending: Radiation Oncology | Admitting: Radiation Oncology

## 2018-03-20 ENCOUNTER — Ambulatory Visit
Admission: RE | Admit: 2018-03-20 | Discharge: 2018-03-20 | Disposition: A | Discharge status: Home / Self Care | Payer: Self-pay | Source: Ambulatory Visit | Attending: Radiation Oncology | Admitting: Radiation Oncology

## 2018-03-23 ENCOUNTER — Ambulatory Visit
Admission: RE | Admit: 2018-03-23 | Discharge: 2018-03-23 | Disposition: A | Discharge status: Home / Self Care | Payer: Self-pay | Source: Ambulatory Visit | Attending: Radiation Oncology | Admitting: Radiation Oncology

## 2018-03-24 ENCOUNTER — Ambulatory Visit
Admission: RE | Admit: 2018-03-24 | Discharge: 2018-03-24 | Disposition: A | Discharge status: Home / Self Care | Payer: Self-pay | Source: Ambulatory Visit | Attending: Radiation Oncology | Admitting: Radiation Oncology

## 2018-03-25 ENCOUNTER — Ambulatory Visit
Admission: RE | Admit: 2018-03-25 | Discharge: 2018-03-25 | Disposition: A | Discharge status: Home / Self Care | Payer: Self-pay | Source: Ambulatory Visit | Attending: Radiation Oncology | Admitting: Radiation Oncology

## 2018-03-26 ENCOUNTER — Ambulatory Visit
Admission: RE | Admit: 2018-03-26 | Discharge: 2018-03-26 | Disposition: A | Discharge status: Home / Self Care | Payer: Self-pay | Source: Ambulatory Visit | Attending: Radiation Oncology | Admitting: Radiation Oncology

## 2018-03-27 ENCOUNTER — Ambulatory Visit
Admission: RE | Admit: 2018-03-27 | Discharge: 2018-03-27 | Disposition: A | Discharge status: Home / Self Care | Payer: Self-pay | Source: Ambulatory Visit | Attending: Radiation Oncology | Admitting: Radiation Oncology

## 2018-03-31 ENCOUNTER — Ambulatory Visit
Admission: RE | Admit: 2018-03-31 | Discharge: 2018-03-31 | Disposition: A | Discharge status: Home / Self Care | Payer: Self-pay | Source: Ambulatory Visit | Attending: Radiation Oncology | Admitting: Radiation Oncology

## 2018-03-31 DIAGNOSIS — Z510 Encounter for antineoplastic radiation therapy: Secondary | ICD-10-CM | POA: Insufficient documentation

## 2018-04-01 ENCOUNTER — Ambulatory Visit
Admission: RE | Admit: 2018-04-01 | Discharge: 2018-04-01 | Disposition: A | Discharge status: Home / Self Care | Payer: Self-pay | Source: Ambulatory Visit | Attending: Radiation Oncology | Admitting: Radiation Oncology

## 2018-04-02 ENCOUNTER — Ambulatory Visit
Admission: RE | Admit: 2018-04-02 | Discharge: 2018-04-02 | Disposition: A | Discharge status: Home / Self Care | Payer: Self-pay | Source: Ambulatory Visit | Attending: Radiation Oncology | Admitting: Radiation Oncology

## 2018-04-03 ENCOUNTER — Ambulatory Visit
Admission: RE | Admit: 2018-04-03 | Discharge: 2018-04-03 | Disposition: A | Discharge status: Home / Self Care | Payer: Self-pay | Source: Ambulatory Visit | Attending: Radiation Oncology | Admitting: Radiation Oncology

## 2018-04-06 ENCOUNTER — Ambulatory Visit: Admission: RE | Admit: 2018-04-06 | Payer: Self-pay | Source: Ambulatory Visit

## 2018-04-07 ENCOUNTER — Ambulatory Visit
Admission: RE | Admit: 2018-04-07 | Discharge: 2018-04-07 | Disposition: A | Discharge status: Home / Self Care | Payer: Self-pay | Source: Ambulatory Visit | Attending: Radiation Oncology | Admitting: Radiation Oncology

## 2018-04-08 ENCOUNTER — Ambulatory Visit
Admission: RE | Admit: 2018-04-08 | Discharge: 2018-04-08 | Disposition: A | Discharge status: Home / Self Care | Payer: Self-pay | Source: Ambulatory Visit | Attending: Radiation Oncology | Admitting: Radiation Oncology

## 2018-04-09 ENCOUNTER — Ambulatory Visit
Admission: RE | Admit: 2018-04-09 | Discharge: 2018-04-09 | Disposition: A | Discharge status: Home / Self Care | Payer: Self-pay | Source: Ambulatory Visit | Attending: Radiation Oncology | Admitting: Radiation Oncology

## 2018-04-10 ENCOUNTER — Ambulatory Visit
Admission: RE | Admit: 2018-04-10 | Discharge: 2018-04-10 | Disposition: A | Discharge status: Home / Self Care | Payer: Self-pay | Source: Ambulatory Visit | Attending: Radiation Oncology | Admitting: Radiation Oncology

## 2018-04-13 ENCOUNTER — Ambulatory Visit
Admission: RE | Admit: 2018-04-13 | Discharge: 2018-04-13 | Disposition: A | Discharge status: Home / Self Care | Payer: Self-pay | Source: Ambulatory Visit | Attending: Radiation Oncology | Admitting: Radiation Oncology

## 2018-04-14 ENCOUNTER — Ambulatory Visit
Admission: RE | Admit: 2018-04-14 | Discharge: 2018-04-14 | Disposition: A | Discharge status: Home / Self Care | Payer: Self-pay | Source: Ambulatory Visit | Attending: Radiation Oncology | Admitting: Radiation Oncology

## 2018-04-15 ENCOUNTER — Ambulatory Visit
Admission: RE | Admit: 2018-04-15 | Discharge: 2018-04-15 | Disposition: A | Discharge status: Home / Self Care | Payer: Self-pay | Source: Ambulatory Visit | Attending: Radiation Oncology | Admitting: Radiation Oncology

## 2018-04-16 ENCOUNTER — Ambulatory Visit
Admission: RE | Admit: 2018-04-16 | Discharge: 2018-04-16 | Disposition: A | Discharge status: Home / Self Care | Payer: Self-pay | Source: Ambulatory Visit | Attending: Radiation Oncology | Admitting: Radiation Oncology

## 2018-04-17 ENCOUNTER — Other Ambulatory Visit: Payer: Self-pay | Admitting: Radiation Oncology

## 2018-04-17 ENCOUNTER — Ambulatory Visit
Admission: RE | Admit: 2018-04-17 | Discharge: 2018-04-17 | Disposition: A | Discharge status: Home / Self Care | Payer: Self-pay | Source: Ambulatory Visit | Attending: Radiation Oncology | Admitting: Radiation Oncology

## 2018-04-17 MED ORDER — SILDENAFIL CITRATE 25 MG PO TABS: 25.0000 mg | ORAL_TABLET | Freq: Every day | ORAL | 0 refills | Status: DC | PRN

## 2018-04-20 ENCOUNTER — Ambulatory Visit
Admission: RE | Admit: 2018-04-20 | Discharge: 2018-04-20 | Disposition: A | Discharge status: Home / Self Care | Payer: Self-pay | Source: Ambulatory Visit | Attending: Radiation Oncology | Admitting: Radiation Oncology

## 2018-04-21 ENCOUNTER — Ambulatory Visit
Admission: RE | Admit: 2018-04-21 | Discharge: 2018-04-21 | Disposition: A | Discharge status: Home / Self Care | Payer: Self-pay | Source: Ambulatory Visit | Attending: Radiation Oncology | Admitting: Radiation Oncology

## 2018-04-22 ENCOUNTER — Ambulatory Visit
Admission: RE | Admit: 2018-04-22 | Discharge: 2018-04-22 | Disposition: A | Discharge status: Home / Self Care | Payer: Self-pay | Source: Ambulatory Visit | Attending: Radiation Oncology | Admitting: Radiation Oncology

## 2018-04-23 ENCOUNTER — Ambulatory Visit
Admission: RE | Admit: 2018-04-23 | Discharge: 2018-04-23 | Disposition: A | Discharge status: Home / Self Care | Payer: Self-pay | Source: Ambulatory Visit | Attending: Radiation Oncology | Admitting: Radiation Oncology

## 2018-04-24 ENCOUNTER — Ambulatory Visit
Admission: RE | Admit: 2018-04-24 | Discharge: 2018-04-24 | Disposition: A | Discharge status: Home / Self Care | Payer: Self-pay | Source: Ambulatory Visit | Attending: Radiation Oncology | Admitting: Radiation Oncology

## 2018-04-27 ENCOUNTER — Ambulatory Visit
Admission: RE | Admit: 2018-04-27 | Discharge: 2018-04-27 | Disposition: A | Discharge status: Home / Self Care | Payer: Self-pay | Source: Ambulatory Visit | Attending: Radiation Oncology | Admitting: Radiation Oncology

## 2018-04-27 ENCOUNTER — Ambulatory Visit: Payer: Self-pay

## 2018-04-28 ENCOUNTER — Ambulatory Visit
Admission: RE | Admit: 2018-04-28 | Discharge: 2018-04-28 | Disposition: A | Discharge status: Home / Self Care | Payer: Self-pay | Source: Ambulatory Visit | Attending: Radiation Oncology | Admitting: Radiation Oncology

## 2018-04-28 ENCOUNTER — Encounter: Payer: Self-pay | Admitting: Medical Oncology

## 2018-04-28 ENCOUNTER — Encounter: Payer: Self-pay | Admitting: Radiation Oncology

## 2018-04-28 DIAGNOSIS — Z510 Encounter for antineoplastic radiation therapy: Secondary | ICD-10-CM | POA: Insufficient documentation

## 2018-04-30 NOTE — Progress Notes (Signed)
  Radiation Oncology         [phone]) [phone] ________________________________  Name: Ryan Wilson MRN: [id number]  Date: 04/28/2018  DOB: [birth year]  End of Treatment Note  Diagnosis:   57 y.o. male with detectable PSA of 0.14 s/p RALP for stage pT3aN0M0 adenocarcinoma of the prostate with Gleason Score of 3+4    Indication for treatment:  Curative, Adjuvant Prostatic Fossa Radiotherapy       Radiation treatment dates:   03/04/2018 - 04/28/2018  Site/dose:   The prostatic fossa was treated to 68.4 Gy in 38 fractions of 1.8 Gy  Beams/energy:   The prostatic fossa was treated using helical intensity modulated radiotherapy delivering 6 megavolt photons. Image guidance was performed with megavoltage CT studies prior to each fraction. He was immobilized with a body fix lower extremity mold.  Narrative: The patient tolerated radiation treatment relatively well.  He experienced some urinary irritation with urgency and nocturia x3. His bowel movements were soft but stayed regular and he noted some blood with bowel movements towards the end of treatment.  Plan: The patient has completed radiation treatment. He will return to radiation oncology clinic for routine followup in one month. I advised him to call or return sooner if he has any questions or concerns related to his recovery or treatment. ________________________________  Sheral Apley. Tammi Klippel, M.D.  This document serves as a record of services personally performed by Tyler Pita, MD. It was created on his behalf by Rae Lips, a trained medical scribe. The creation of this record is based on the scribe's personal observations and the provider's statements to them. This document has been checked and approved by the attending provider.

## 2018-05-29 ENCOUNTER — Encounter: Payer: Self-pay | Admitting: Urology

## 2018-05-29 ENCOUNTER — Other Ambulatory Visit: Payer: Self-pay

## 2018-05-29 ENCOUNTER — Ambulatory Visit
Admission: RE | Admit: 2018-05-29 | Discharge: 2018-05-29 | Disposition: A | Discharge status: Home / Self Care | Payer: Self-pay | Source: Ambulatory Visit | Attending: Urology | Admitting: Urology

## 2018-05-29 VITALS — BP 122/88 | HR 74 | Temp 98.3°F | Resp 18 | Ht 71.0 in | Wt 176.4 lb

## 2018-05-29 DIAGNOSIS — C61 Malignant neoplasm of prostate: Secondary | ICD-10-CM | POA: Insufficient documentation

## 2018-05-29 DIAGNOSIS — Z79899 Other long term (current) drug therapy: Secondary | ICD-10-CM | POA: Insufficient documentation

## 2018-05-29 DIAGNOSIS — Z923 Personal history of irradiation: Secondary | ICD-10-CM | POA: Insufficient documentation

## 2018-05-29 NOTE — Progress Notes (Signed)
Radiation Oncology         [id number]) [phone] ________________________________  Name: Ryan Wilson MRN: [id number]  Date: 05/29/2018  DOB: March 27, 1961  Post Treatment Note  CC: [facility]  Alexis Frock, MD  Diagnosis:   57 y.o. male with detectable PSA of 0.14 s/p RALP for stage pT3aN0M0 adenocarcinoma of the prostate with Gleason Score of 3+4   Interval Since Last Radiation:  4.5 weeks  03/04/2018 - 04/28/2018: The prostatic fossa was treated to 68.4 Gy in 38 fractions of 1.8 Gy  Narrative:  The patient returns today for routine follow-up.  He tolerated radiation treatment relatively well.  He experienced some urinary irritation with urgency and nocturia x3. His bowel movements were soft but stayed regular and he noted some blood with bowel movements towards the end of treatment.                              On review of systems, the patient states that he is doing well overall.  His lower urinary tract symptoms are gradually improving but he does report residual frequency and nocturia x3.  His current IPSS score is 12 indicating moderate urinary symptoms.  He specifically denies dysuria, gross hematuria, straining to void, intermittency or incontinence.  He feels that he empties his bladder well on voiding for the most part.  He denies abdominal pain, nausea, vomiting, diarrhea or constipation.  He does continue to struggle with significant fatigue which is limiting his ability to remain active throughout the day and is difficult for him to complete a normal workday.  He reports a healthy appetite and is maintaining his weight.  He has a scheduled follow-up visit with Dr. Tresa Moore on July 27, 2018.  ALLERGIES:  has No Known Allergies.  Meds: Current Outpatient Medications  Medication Sig Dispense Refill  . naproxen (NAPROSYN) 500 MG tablet naproxen 500 mg tablet  TK 1 T PO  BID    . omeprazole (PRILOSEC) 40 MG capsule omeprazole 40 mg capsule,delayed release  TK 1 C PO D     . sildenafil (VIAGRA) 25 MG tablet Take 1 tablet (25 mg total) by mouth daily as needed for erectile dysfunction. (Patient not taking: Reported on 05/29/2018) 10 tablet 0   No current facility-administered medications for this encounter.     Physical Findings:  height is 5\' 11"  (1.803 m) and weight is 176 lb 6.4 oz (80 kg). His oral temperature is 98.3 F (36.8 C). His blood pressure is 122/88 and his pulse is 74. His respiration is 18 and oxygen saturation is 99%.  Pain Assessment Pain Score: 5  Pain Loc: Abdomen/10 In general this is a well appearing African-American male in no acute distress.  He's alert and oriented x4 and appropriate throughout the examination. Cardiopulmonary assessment is negative for acute distress and he exhibits normal effort.   Lab Findings: Lab Results  Component Value Date   WBC 6.7 07/16/2017   HGB 13.9 07/19/2017   HCT 40.1 07/19/2017   MCV 90.3 07/16/2017   PLT 192 07/16/2017     Radiographic Findings: No results found.  Impression/Plan: 1. 57 y.o. male with detectable PSA of 0.14 s/p RALP for stage pT3aN0M0 adenocarcinoma of the prostate with Gleason Score of 3+4.   He will continue to follow up with urology for ongoing PSA determinations and has an appointment scheduled with Dr. Tresa Moore on 07/27/18. He understands what to expect with regards to PSA  monitoring going forward. I will look forward to following his response to treatment via correspondence with urology, and would be happy to continue to participate in his care if clinically indicated. I talked to the patient about what to expect in the future, including his risk for erectile dysfunction and rectal bleeding. I encouraged him to call or return to the office if he has any questions regarding his previous radiation or possible radiation side effects. He was comfortable with this plan and will follow up as needed.  2. Fatigue.  He has requested that we complete paperwork for disability.  While  we are happy to assist with getting his paperwork started, this is probably most appropriate to come through Dr. Zettie Pho office at [facility] urology since they will be following him long-term going forward.    Nicholos Johns, PA-C

## 2018-06-17 ENCOUNTER — Encounter: Payer: Self-pay | Admitting: Internal Medicine

## 2018-06-17 ENCOUNTER — Ambulatory Visit (INDEPENDENT_AMBULATORY_CARE_PROVIDER_SITE_OTHER): Payer: Self-pay | Admitting: Internal Medicine

## 2018-06-17 ENCOUNTER — Other Ambulatory Visit: Payer: Self-pay

## 2018-06-17 VITALS — BP 118/76 | HR 80 | Temp 98.9°F | Ht 69.0 in | Wt 179.0 lb

## 2018-06-17 DIAGNOSIS — Z789 Other specified health status: Secondary | ICD-10-CM

## 2018-06-17 DIAGNOSIS — Z87891 Personal history of nicotine dependence: Secondary | ICD-10-CM

## 2018-06-17 DIAGNOSIS — Z7289 Other problems related to lifestyle: Secondary | ICD-10-CM

## 2018-06-17 DIAGNOSIS — N521 Erectile dysfunction due to diseases classified elsewhere: Secondary | ICD-10-CM

## 2018-06-17 DIAGNOSIS — K219 Gastro-esophageal reflux disease without esophagitis: Secondary | ICD-10-CM

## 2018-06-17 DIAGNOSIS — Z86010 Personal history of colonic polyps: Secondary | ICD-10-CM

## 2018-06-17 DIAGNOSIS — Z79899 Other long term (current) drug therapy: Secondary | ICD-10-CM

## 2018-06-17 DIAGNOSIS — K4090 Unilateral inguinal hernia, without obstruction or gangrene, not specified as recurrent: Secondary | ICD-10-CM

## 2018-06-17 DIAGNOSIS — Z8546 Personal history of malignant neoplasm of prostate: Secondary | ICD-10-CM

## 2018-06-17 DIAGNOSIS — C61 Malignant neoplasm of prostate: Secondary | ICD-10-CM

## 2018-06-17 DIAGNOSIS — Z0000 Encounter for general adult medical examination without abnormal findings: Secondary | ICD-10-CM

## 2018-06-17 DIAGNOSIS — Z9079 Acquired absence of other genital organ(s): Secondary | ICD-10-CM

## 2018-06-17 MED ORDER — OMEPRAZOLE 40 MG PO CPDR: 40.0000 mg | DELAYED_RELEASE_CAPSULE | Freq: Every day | ORAL | 3 refills | Status: DC

## 2018-06-17 NOTE — Patient Instructions (Addendum)
Thank you for allowing [place] to provide your care today. Today we discussed your medical conditions.   We would like to get some information from your previous doctors to see what tests they have ordered so that we don't recheck them.  We also need to get the results of your previous colonoscopy.   Today we made no changes to your medications.   We have refilled your omeprazole.   Please follow-up in 6 months.    Should you have any questions or concerns please call the internal medicine clinic at [phone].
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
## 2018-06-18 DIAGNOSIS — Z13220 Encounter for screening for lipoid disorders: Secondary | ICD-10-CM | POA: Insufficient documentation

## 2018-06-18 DIAGNOSIS — N521 Erectile dysfunction due to diseases classified elsewhere: Secondary | ICD-10-CM | POA: Insufficient documentation

## 2018-06-18 DIAGNOSIS — Z7289 Other problems related to lifestyle: Secondary | ICD-10-CM | POA: Insufficient documentation

## 2018-06-18 DIAGNOSIS — F1090 Alcohol use, unspecified, uncomplicated: Secondary | ICD-10-CM | POA: Insufficient documentation

## 2018-06-18 DIAGNOSIS — Z789 Other specified health status: Secondary | ICD-10-CM | POA: Insufficient documentation

## 2018-06-18 DIAGNOSIS — K219 Gastro-esophageal reflux disease without esophagitis: Secondary | ICD-10-CM | POA: Insufficient documentation

## 2018-06-18 DIAGNOSIS — Z0000 Encounter for general adult medical examination without abnormal findings: Secondary | ICD-10-CM | POA: Insufficient documentation

## 2018-06-18 NOTE — Progress Notes (Signed)
Internal Medicine Clinic Attending  I saw and evaluated the patient.  I personally confirmed the key portions of the history and exam documented by Dr. Krienke and I reviewed pertinent patient test results.  The assessment, diagnosis, and plan were formulated together and I agree with the documentation in the resident's note.    

## 2018-06-18 NOTE — Assessment & Plan Note (Addendum)
Offered patient flu shot, patient declined. Also discussed checking for HIV and Hep C, he declined this at this time. He does not like needles. He reports that he has had a colonoscopy recently and had some polyps removed. Will need to get the information from [facility] and the GI office.

## 2018-06-18 NOTE — Assessment & Plan Note (Signed)
Patient reports that he has a history of GERD and he has been on omeprazole 40 mg. He denies any symptoms of reflux at the moment and feels like the medication is doing well.   -Refill omeprazole 40 mg daily

## 2018-06-18 NOTE — Assessment & Plan Note (Signed)
Stage pT3aN0M0 adenocarcinoma of the prostate with Gleason Score of 3+4. S/p prostatectomy in Dec 2019 and XRT. Followed by Dr. Tammi Klippel.

## 2018-06-18 NOTE — Assessment & Plan Note (Signed)
He does report that he drinks about 2 beers per day on the weekdays and 5-6 beers on the weekends. He denies ever having withdrawal symptoms, denied any CAGE questions. Counseled patient that this is an excessive amount of EtOH to consume, discussed the increased risks of liver disease, cardiac disease, hypertension and other issues. He reported that he will try to decrease the amount of his alcohol consumption.   Plan: -Counseled about EtOH abuse, reported that he will try to cut down

## 2018-06-18 NOTE — Assessment & Plan Note (Signed)
He does report that he has some issues with obtaining an erection, he reports that this started after his prostatectomy. He has not been having morning erections. He has tried Viagra but he reports that this does not help. He is being followed by urology at the moment and they had discussed other options.   Plan: -Follow up with urology

## 2018-08-12 ENCOUNTER — Ambulatory Visit: Payer: Self-pay

## 2018-08-27 NOTE — Addendum Note (Signed)
Encounter addended by: Wilmon Arms, RN on: 08/27/2018 11:27 AM  Actions taken: Visit Navigator Flowsheet section accepted

## 2018-10-30 ENCOUNTER — Encounter: Payer: Self-pay | Admitting: General Practice

## 2018-10-30 NOTE — Progress Notes (Signed)
[facility] Support Services   [facility] Support Services Team contacted patient to assess for food insecurity and other psychosocial needs during current COVID19 pandemic.    Patient/family expressed no needs at this time.  Support Team member encouraged patient to call if changes occur or they have any other questions/concerns.    Kailly Richoux C Zamirah Denny, LCSW  [facility]        
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
## 2019-02-17 ENCOUNTER — Other Ambulatory Visit: Payer: Self-pay

## 2019-02-17 ENCOUNTER — Ambulatory Visit (INDEPENDENT_AMBULATORY_CARE_PROVIDER_SITE_OTHER): Payer: Self-pay | Admitting: Internal Medicine

## 2019-02-17 ENCOUNTER — Encounter: Payer: Self-pay | Admitting: Internal Medicine

## 2019-02-17 VITALS — BP 122/91 | HR 88 | Temp 98.1°F | Ht 69.0 in | Wt 173.3 lb

## 2019-02-17 DIAGNOSIS — Z0000 Encounter for general adult medical examination without abnormal findings: Secondary | ICD-10-CM

## 2019-02-17 DIAGNOSIS — M25561 Pain in right knee: Secondary | ICD-10-CM | POA: Insufficient documentation

## 2019-02-17 DIAGNOSIS — Z87891 Personal history of nicotine dependence: Secondary | ICD-10-CM

## 2019-02-17 DIAGNOSIS — Z8546 Personal history of malignant neoplasm of prostate: Secondary | ICD-10-CM

## 2019-02-17 DIAGNOSIS — G8929 Other chronic pain: Secondary | ICD-10-CM

## 2019-02-17 DIAGNOSIS — Z86010 Personal history of colonic polyps: Secondary | ICD-10-CM

## 2019-02-17 DIAGNOSIS — M545 Low back pain, unspecified: Secondary | ICD-10-CM | POA: Insufficient documentation

## 2019-02-17 DIAGNOSIS — M25562 Pain in left knee: Secondary | ICD-10-CM

## 2019-02-17 DIAGNOSIS — Z9079 Acquired absence of other genital organ(s): Secondary | ICD-10-CM

## 2019-02-17 DIAGNOSIS — C61 Malignant neoplasm of prostate: Secondary | ICD-10-CM

## 2019-02-17 DIAGNOSIS — K219 Gastro-esophageal reflux disease without esophagitis: Secondary | ICD-10-CM

## 2019-02-17 MED ORDER — DICLOFENAC SODIUM 1 % TD GEL: 2.0000 g | Freq: Four times a day (QID) | TRANSDERMAL | 0 refills | Status: DC

## 2019-02-17 NOTE — Assessment & Plan Note (Signed)
Patient follows with Dr. Tammi Klippel, he had stage pT3aN0M0 adenocarcinoma status post prostatectomy in December 2019 and XRT.  He recently saw Dr. Tammi Klippel about 3 weeks ago, has follow-up with him in December.  He had been told that he is currently cancer free.  Denies any issues at this time.

## 2019-02-17 NOTE — Assessment & Plan Note (Signed)
Patient reports that for a few years now he has been having this lower back pain, he states that it is worse with specific movements such as twisting his back, and improves when he takes Advil.  He denies any fevers, weight loss, urinary or bowel incontinence at this time. Mostly in the lower back area, no radiation. On exam he has no point tenderness on no tenderness to palpation in the lower back area.  No red flag symptoms noted at this time.  Since this is a chronic issue and he denies any changes in the characteristics of the pain will hold off on imaging at this time.  However given his history of prostate cancer and the higher risk of metastases advised patient to monitor closely and if anything changes to contact us to obtain further imaging.  Plan: -Monitor for now -Advised patient that there is a high risk of metastases prostate cancer so with any changes occur to contact us to let us know and we will obtain further imaging at that time, likely will get a nuclear medicine bone scan if this occurs

## 2019-02-17 NOTE — Assessment & Plan Note (Signed)
He is not taking omeprazole 40 mg daily. He denies any issues with acid reflux and denies any other issues. Will hold off for now.

## 2019-02-17 NOTE — Assessment & Plan Note (Signed)
Patient had a colonoscopy and had polyps removed, he is not sure when this happened but thinks that it was with [facility] health care, will need to obtain this information to determine when his next screening colonoscopy will need to be done.  Discussed obtaining a Tdap, will hold off for now and readdress at later time.
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
## 2019-02-17 NOTE — Patient Instructions (Addendum)
[name],  It was a pleasure to see you today. Thank you for coming in.   Today we discussed your knee pain, back pain, and prostate cancer. I am glad that you are overall doing well.   In regards to your knee pain, please start using voltaren gel as needed, you can use this on both knees if you need to. I have sent in a prescription but you may be able to get this over the counter if you need to. You can continue taking tylenol as needed.   In regards to your back pain, please continue to monitor this, you can take tylenol for the pain. If your pain worsens, you start having fevers, the pain changes in any way, or you start having incontinence please let us know, we may need to do some imaging at that time.    We are getting information about your colonoscopy and will let you know when you need to repeat this.   Please return to clinic in 3 months or sooner if needed.   Thank you again for coming in.   Asencion Noble.D.

## 2019-02-18 NOTE — Progress Notes (Signed)
Internal Medicine Clinic Attending  Case discussed with Dr. Krienke at the time of the visit.  We reviewed the resident's history and exam and pertinent patient test results.  I agree with the assessment, diagnosis, and plan of care documented in the resident's note.    

## 2019-03-10 ENCOUNTER — Encounter: Payer: Self-pay | Admitting: Internal Medicine

## 2019-03-10 ENCOUNTER — Ambulatory Visit: Payer: Self-pay

## 2019-05-17 ENCOUNTER — Encounter: Payer: Self-pay | Admitting: Internal Medicine

## 2019-05-17 ENCOUNTER — Telehealth: Payer: Self-pay

## 2019-05-17 NOTE — Telephone Encounter (Signed)
I called this afternoon about his appointment today, he was sorry he missed it and he also said at this time he does not have insurance but he is waiting for that to kick in.I explained to him if his insurance does not happen, come in and talk to our [facility]. I also let patient no we would still see him with or without insurance. I just do not want to run up a bill Ryan Wilson, [name] C10/19/20204:30 PM

## 2019-05-17 NOTE — Progress Notes (Deleted)
   CC: ***  HPI:  [name] is a 58 y.o.  with a PMH listed below presenting for ***   Right knee pain:    Please see A&P for status of the patient's chronic medical conditions  Past Medical History:  Diagnosis Date  . Back pain   . Heart murmur    PER PATIENT WAS HEARD BY A NURSE; DENIES SYMPTOMS    . Inguinal hernia    REPAIRED  IN 1990s  . Prostate cancer [facility])    Review of Systems: Refer to history of present illness and assessment and plans for pertinent review of systems, all others reviewed and negative.  Physical Exam:  There were no vitals filed for this visit. *** Physical Exam  Social History   Socioeconomic History  . Marital status: Married    Spouse name: Not on file  . Number of children: Not on file  . Years of education: Not on file  . Highest education level: Not on file  Occupational History  . Not on file  Social Needs  . Financial resource strain: Not on file  . Food insecurity    Worry: Not on file    Inability: Not on file  . Transportation needs    Medical: Not on file    Non-medical: Not on file  Tobacco Use  . Smoking status: Former Smoker    Packs/day: 0.50    Years: 4.00    Pack years: 2.00    Types: Cigarettes    Quit date: 07/16/2002    Years since quitting: 16.8  . Smokeless tobacco: Never Used  Substance and Sexual Activity  . Alcohol use: Yes    Comment: social drinker  . Drug use: No  . Sexual activity: Not Currently  Lifestyle  . Physical activity    Days per week: Not on file    Minutes per session: Not on file  . Stress: Not on file  Relationships  . Social Herbalist on phone: Not on file    Gets together: Not on file    Attends religious service: Not on file    Active member of club or organization: Not on file    Attends meetings of clubs or organizations: Not on file    Relationship status: Not on file  . Intimate partner violence    Fear of current or ex partner: Not on file   Emotionally abused: Not on file    Physically abused: Not on file    Forced sexual activity: Not on file  Other Topics Concern  . Not on file  Social History Narrative   05-29-18 Unable to ask abuse questions today wife with him.   *** Family History  Problem Relation Age of Onset  . Prostate cancer Brother     Assessment & Plan:   See Encounters Tab for problem based charting.  Patient {GC/GE:3044014::"discussed with","seen with"} Dr. {NAMES:3044014::"Butcher","Granfortuna","E. Hoffman","Klima","Mullen","Narendra","Raines","Vincent"}

## 2019-05-24 ENCOUNTER — Encounter: Payer: Self-pay | Admitting: Internal Medicine

## 2019-10-14 ENCOUNTER — Ambulatory Visit: Payer: Self-pay | Attending: Internal Medicine

## 2019-10-14 DIAGNOSIS — Z23 Encounter for immunization: Secondary | ICD-10-CM

## 2019-10-14 NOTE — Progress Notes (Signed)
   Covid-19 Vaccination Clinic  Name:  Ryan Wilson    MRN: [id number] DOB: [birth year]  10/14/2019  Ryan Wilson was observed post Covid-19 immunization for 15 minutes without incident. He was provided with Vaccine Information Sheet and instruction to access the V-Safe system.   Ryan Wilson was instructed to call 911 with any severe reactions post vaccine: Marland Kitchen Difficulty breathing  . Swelling of face and throat  . A fast heartbeat  . A bad rash all over body  . Dizziness and weakness   Immunizations Administered    Name Date Dose VIS Date Route   Pfizer COVID-19 Vaccine 10/14/2019  3:07 PM 0.3 mL 07/09/2019 Intramuscular   Manufacturer: [place]   Lot: [id number]   [place]: [id number]

## 2019-11-08 ENCOUNTER — Ambulatory Visit: Payer: Self-pay | Attending: Internal Medicine

## 2019-11-08 DIAGNOSIS — Z23 Encounter for immunization: Secondary | ICD-10-CM

## 2019-11-08 NOTE — Progress Notes (Signed)
   Covid-19 Vaccination Clinic  Name:  Ryan Wilson    MRN: [id number] DOB: 11/07/1960  11/08/2019  Ryan Wilson was observed post Covid-19 immunization for 15 minutes without incident. He was provided with Vaccine Information Sheet and instruction to access the V-Safe system.   Ryan Wilson was instructed to call 911 with any severe reactions post vaccine: Marland Kitchen Difficulty breathing  . Swelling of face and throat  . A fast heartbeat  . A bad rash all over body  . Dizziness and weakness   Immunizations Administered    Name Date Dose VIS Date Route   Pfizer COVID-19 Vaccine 11/08/2019  9:43 AM 0.3 mL 07/09/2019 Intramuscular   Manufacturer: [place]   Lot: [id number]   [place]: [id number]

## 2020-01-24 ENCOUNTER — Encounter: Payer: Medicare (Managed Care) | Admitting: Internal Medicine

## 2020-01-24 ENCOUNTER — Other Ambulatory Visit: Payer: Self-pay

## 2020-06-19 ENCOUNTER — Ambulatory Visit (HOSPITAL_COMMUNITY)
Admission: EM | Admit: 2020-06-19 | Discharge: 2020-06-19 | Disposition: A | Discharge status: Home / Self Care | Payer: Medicare (Managed Care) | Attending: Urgent Care | Admitting: Urgent Care

## 2020-06-19 ENCOUNTER — Encounter (HOSPITAL_COMMUNITY): Payer: Self-pay

## 2020-06-19 ENCOUNTER — Other Ambulatory Visit: Payer: Self-pay

## 2020-06-19 DIAGNOSIS — H11002 Unspecified pterygium of left eye: Secondary | ICD-10-CM

## 2020-06-19 MED ORDER — DICLOFENAC SODIUM 0.1 % OP SOLN: 1.0000 [drp] | Freq: Four times a day (QID) | OPHTHALMIC | 0 refills | Status: DC

## 2020-06-19 NOTE — ED Triage Notes (Signed)
Pt presents with an abscess on he left eye x 3 weeks. He denies blurry vision, he states that he feels a burning sensation in his eye.

## 2020-06-19 NOTE — ED Provider Notes (Signed)
[facility]   MRN: [id number] DOB: Dec 29, 1960  Subjective:   Ryan Wilson is a 59 y.o. male presenting for 3 week hx of persistent left eye discomfort. Denies vision changes, swelling, photophobia, eye trauma. Has a hx of pterygium that had to be removed surgically for his right eye. Does not have an eye doctor.   No current facility-administered medications for this encounter.  Current Outpatient Medications:  .  diclofenac sodium (VOLTAREN) 1 % GEL, Apply 2 g topically 4 (four) times daily. Apply to knees, Disp: 50 g, Rfl: 0 .  naproxen (NAPROSYN) 500 MG tablet, naproxen 500 mg tablet  TK 1 T PO  BID, Disp: , Rfl:  .  omeprazole (PRILOSEC) 40 MG capsule, Take 1 capsule (40 mg total) by mouth daily., Disp: 90 capsule, Rfl: 3 .  oxybutynin (DITROPAN) 5 MG tablet, Take 5 mg by mouth 3 (three) times daily as needed for bladder spasms., Disp: , Rfl:  .  sildenafil (VIAGRA) 25 MG tablet, Take 1 tablet (25 mg total) by mouth daily as needed for erectile dysfunction. (Patient not taking: Reported on 05/29/2018), Disp: 10 tablet, Rfl: 0   Allergies  Allergen Reactions  . No Known Allergies     Past Medical History:  Diagnosis Date  . Back pain   . Heart murmur    PER PATIENT WAS HEARD BY A NURSE; DENIES SYMPTOMS    . Inguinal hernia    REPAIRED  IN 1990s  . Prostate cancer [facility]      Past Surgical History:  Procedure Laterality Date  . HERNIA REPAIR  1995, 1996   INGUINAL HERNIA   . PELVIC LYMPH NODE DISSECTION Bilateral 07/18/2017   Procedure: LIMITED PELVIC LYMPH NODE DISSECTION;  Surgeon: Alexis Frock, MD;  Location: [facility];  Service: Urology;  Laterality: Bilateral;  . PROSTATE BIOPSY    . ROBOT ASSISTED LAPAROSCOPIC RADICAL PROSTATECTOMY N/A 07/18/2017   Procedure: XI ROBOTIC ASSISTED LAPAROSCOPIC RADICAL PROSTATECTOMY, RIGHT INGUINAL HERNIA REPAIR;  Surgeon: Alexis Frock, MD;  Location: [facility];  Service: Urology;  Laterality: N/A;    Family History   Problem Relation Age of Onset  . Prostate cancer Brother     Social History   Tobacco Use  . Smoking status: Former Smoker    Packs/day: 0.50    Years: 4.00    Pack years: 2.00    Types: Cigarettes    Quit date: 07/16/2002    Years since quitting: 17.9  . Smokeless tobacco: Never Used  Vaping Use  . Vaping Use: Never used  Substance Use Topics  . Alcohol use: Yes    Comment: social drinker  . Drug use: No    ROS   Objective:   Vitals: BP 117/83 (BP Location: Right Arm)   Pulse 80   Temp 98.2 F (36.8 C) (Oral)   Resp 16   SpO2 98%   Physical Exam Constitutional:      General: He is not in acute distress.    Appearance: Normal appearance. He is well-developed and normal weight. He is not ill-appearing, toxic-appearing or diaphoretic.  HENT:     Head: Normocephalic and atraumatic.     Right Ear: External ear normal.     Left Ear: External ear normal.     Nose: Nose normal.     Mouth/Throat:     Pharynx: Oropharynx is clear.  Eyes:     General: Lids are everted, no foreign bodies appreciated. No scleral icterus.  Right eye: No foreign body, discharge or hordeolum.        Left eye: No foreign body, discharge or hordeolum.     Extraocular Movements: Extraocular movements intact.     Conjunctiva/sclera:     Right eye: Right conjunctiva is not injected. No chemosis, exudate or hemorrhage.    Left eye: Left conjunctiva is not injected. No chemosis, exudate or hemorrhage.    Pupils: Pupils are equal, round, and reactive to light.   Cardiovascular:     Rate and Rhythm: Normal rate.  Pulmonary:     Effort: Pulmonary effort is normal.  Musculoskeletal:     Cervical back: Normal range of motion.  Skin:    General: Skin is warm and dry.  Neurological:     Mental Status: He is alert and oriented to person, place, and time.  Psychiatric:        Mood and Affect: Mood normal.        Behavior: Behavior normal.        Thought Content: Thought content normal.         Judgment: Judgment normal.      Assessment and Plan :   PDMP not reviewed this encounter.  1. Pterygium of left eye     Counseled on general nature of pterygium and recommended consult with an ophthalmologist. In the meantime, use diclofenac eye drops. Counseled patient on potential for adverse effects with medications prescribed/recommended today, ER and return-to-clinic precautions discussed, patient verbalized understanding.    Jaynee Eagles, [place] 06/19/20 [phone]

## 2020-07-11 ENCOUNTER — Encounter (HOSPITAL_COMMUNITY): Payer: Self-pay

## 2020-07-11 ENCOUNTER — Ambulatory Visit (HOSPITAL_COMMUNITY)
Admission: EM | Admit: 2020-07-11 | Discharge: 2020-07-11 | Disposition: A | Discharge status: Home / Self Care | Payer: Medicare (Managed Care) | Attending: Family Medicine | Admitting: Family Medicine

## 2020-07-11 ENCOUNTER — Other Ambulatory Visit: Payer: Self-pay

## 2020-07-11 DIAGNOSIS — S91332A Puncture wound without foreign body, left foot, initial encounter: Secondary | ICD-10-CM | POA: Diagnosis not present

## 2020-07-11 DIAGNOSIS — Z23 Encounter for immunization: Secondary | ICD-10-CM

## 2020-07-11 MED ORDER — TETANUS-DIPHTH-ACELL PERTUSSIS 5-2.5-18.5 LF-MCG/0.5 IM SUSY
PREFILLED_SYRINGE | INTRAMUSCULAR | Status: AC
  Filled 2020-07-11: qty 0.5

## 2020-07-11 MED ORDER — TETANUS-DIPHTH-ACELL PERTUSSIS 5-2.5-18.5 LF-MCG/0.5 IM SUSY
0.5000 mL | PREFILLED_SYRINGE | Freq: Once | INTRAMUSCULAR | Status: AC
  Administered 2020-07-11: 14:00:00 0.5 mL via INTRAMUSCULAR

## 2020-07-11 MED ORDER — IBUPROFEN 800 MG PO TABS: 800.0000 mg | ORAL_TABLET | Freq: Three times a day (TID) | ORAL | 0 refills | Status: DC

## 2020-07-11 MED ORDER — AMOXICILLIN-POT CLAVULANATE 875-125 MG PO TABS: 1.0000 | ORAL_TABLET | Freq: Two times a day (BID) | ORAL | 0 refills | Status: DC

## 2020-07-11 NOTE — ED Provider Notes (Signed)
  [place]   [id number] 07/11/20 Arrival Time: [place]:  1. Puncture wound of left foot, initial encounter    Discussed s/s of infection.  Meds ordered this encounter  Medications  . ibuprofen (ADVIL) 800 MG tablet    Sig: Take 1 tablet (800 mg total) by mouth 3 (three) times daily with meals.    Dispense:  21 tablet    Refill:  0  . amoxicillin-clavulanate (AUGMENTIN) 875-125 MG tablet    Sig: Take 1 tablet by mouth every 12 (twelve) hours.    Dispense:  14 tablet    Refill:  0  . Tdap (BOOSTRIX) injection 0.5 mL   Activities as tolerated.   Follow-up Information    Asencion Noble, MD.   Specialty: Internal Medicine Why: As needed. Contact information: [address] [place] [address] [phone]               Reviewed expectations re: course of current medical issues. Questions answered. Outlined signs and symptoms indicating need for more acute intervention. Patient verbalized understanding. After Visit Summary given.   SUBJECTIVE:  Ryan Wilson is a 59 y.o. male who presents with a puncture wound of L planter foot; distal; today; stepped on rusty nail; through boot. Very tender since. Ambulatory without difficulty. No extremity sensation changes or weakness. No OTC tx. Initial bleeding controlled.  Td UTD: No.  OBJECTIVE:  Vitals:   07/11/20 1359  BP: 126/89  Pulse: 76  Resp: 17  Temp: 98.3 F (36.8 C)  TempSrc: Oral  SpO2: 97%     General appearance: alert; no distress Skin: small puncture wound of distal plantar L foot; no active bleeding or drainage; minimal erythema; area is TTP; normal distal sensation Psychological: alert and cooperative; normal mood and affect   Allergies  Allergen Reactions  . No Known Allergies     Past Medical History:  Diagnosis Date  . Back pain   . Heart murmur    PER PATIENT WAS HEARD BY A NURSE; DENIES SYMPTOMS    . Inguinal hernia    REPAIRED  IN 1990s  .  Prostate cancer [facility])    Social History   Socioeconomic History  . Marital status: Married    Spouse name: Not on file  . Number of children: Not on file  . Years of education: Not on file  . Highest education level: Not on file  Occupational History  . Not on file  Tobacco Use  . Smoking status: Former Smoker    Packs/day: 0.50    Years: 4.00    Pack years: 2.00    Types: Cigarettes    Quit date: 07/16/2002    Years since quitting: 18.0  . Smokeless tobacco: Never Used  Vaping Use  . Vaping Use: Never used  Substance and Sexual Activity  . Alcohol use: Yes    Comment: social drinker  . Drug use: No  . Sexual activity: Not Currently  Other Topics Concern  . Not on file  Social History Narrative   05-29-18 Unable to ask abuse questions today wife with him.   Social Determinants of Health   Financial Resource Strain: Not on file  Food Insecurity: Not on file  Transportation Needs: Not on file  Physical Activity: Not on file  Stress: Not on file  Social Connections: Not on file         Vanessa Kick, MD 07/11/20 1421

## 2020-07-11 NOTE — ED Triage Notes (Signed)
Pt presents with with presents with pain in the left foot x 2 hrs. Reporst he stepped over a nail.  Pt do not remember the last time e had Tdap shot.

## 2020-07-11 NOTE — Discharge Instructions (Signed)
Meds ordered this encounter  Medications   ibuprofen (ADVIL) 800 MG tablet    Sig: Take 1 tablet (800 mg total) by mouth 3 (three) times daily with meals.    Dispense:  21 tablet    Refill:  0   amoxicillin-clavulanate (AUGMENTIN) 875-125 MG tablet    Sig: Take 1 tablet by mouth every 12 (twelve) hours.    Dispense:  14 tablet    Refill:  0   Tdap (BOOSTRIX) injection 0.5 mL

## 2020-08-08 ENCOUNTER — Other Ambulatory Visit: Payer: Self-pay

## 2020-08-08 ENCOUNTER — Ambulatory Visit (HOSPITAL_COMMUNITY)
Admission: EM | Admit: 2020-08-08 | Discharge: 2020-08-08 | Disposition: A | Discharge status: Home / Self Care | Payer: Medicare (Managed Care) | Attending: Family Medicine | Admitting: Family Medicine

## 2020-08-08 ENCOUNTER — Encounter (HOSPITAL_COMMUNITY): Payer: Self-pay

## 2020-08-08 DIAGNOSIS — K625 Hemorrhage of anus and rectum: Secondary | ICD-10-CM | POA: Diagnosis not present

## 2020-08-08 NOTE — ED Provider Notes (Signed)
[place]   [id number] 08/08/20 Arrival Time: 0936  ASSESSMENT & PLAN:  1. BRBPR (bright red blood per rectum)     See AVS for d/c information. Appears to have a resolving ext hemorrhoid. Discussed need for GI evaluation to talk about colonoscopy. He voices understanding.    Discharge Instructions     Begin using over the counter MIRALAX to soften your stools.     Recommend:  Follow-up Information    Schedule an appointment as soon as possible for a visit  with Gastroenterology, [facility].   Contact information: [place] [address] [phone]        Schedule an appointment as soon as possible for a visit  with [facility].   Specialty: Gastroenterology Contact information: [address]  [place] [address] [phone]             Benign exam.  Reviewed expectations re: course of current medical issues. Questions answered. Outlined signs and symptoms indicating need for more acute intervention. Patient verbalized understanding. After Visit Summary given.   SUBJECTIVE: History from: patient.  Ryan Wilson is a 60 y.o. male who reports noting BRBPR 3 day ago after straining to have BM. "Felt a lump or a knot" around rectum; painful; has almost resolved. Still noting some BRB on toilet tissue after BM. No abd pain. Normal urination. Unsure if he's had a colonoscopy; h/o prostate cancer with surgery. No current rectal pain.   Past Surgical History:  Procedure Laterality Date  . HERNIA REPAIR  1995, 1996   INGUINAL HERNIA   . PELVIC LYMPH NODE DISSECTION Bilateral 07/18/2017   Procedure: LIMITED PELVIC LYMPH NODE DISSECTION;  Surgeon: Alexis Frock, MD;  [facility];  Service: Urology;  Laterality: Bilateral;  . PROSTATE BIOPSY    . ROBOT ASSISTED LAPAROSCOPIC RADICAL PROSTATECTOMY N/A 07/18/2017   Procedure: XI ROBOTIC ASSISTED LAPAROSCOPIC RADICAL PROSTATECTOMY, RIGHT  INGUINAL HERNIA REPAIR;  Surgeon: Alexis Frock, MD;  Location: [facility];  Service: Urology;  Laterality: N/A;     OBJECTIVE:  Vitals:   08/08/20 1038 08/08/20 1040  BP: (!) 131/99   Pulse: 80   Resp: 17   Temp: 98 F (36.7 C)   TempSrc: Oral   SpO2: 98% 98%    General appearance: alert; no distress Abdomen: soft; non-distended; no significant abdominal tenderness; no masses or organomegaly; no guarding or rebound tenderness Rectal: small ext hemorrhoid without thrombosis; no gross blood on exam; tolerated exam well Back: no CVA tenderness Extremities: no edema; symmetrical with no gross deformities Skin: warm; dry Neurologic: normal gait Psychological: alert and cooperative; normal mood and affect    Allergies  Allergen Reactions  . No Known Allergies                                                Past Medical History:  Diagnosis Date  . Back pain   . Heart murmur    PER PATIENT WAS HEARD BY A NURSE; DENIES SYMPTOMS    . Inguinal hernia    REPAIRED  IN 1990s  . Prostate cancer [facility])    Social History   Socioeconomic History  . Marital status: Married    Spouse name: Not on file  . Number of children: Not on file  . Years of education: Not on file  . Highest education  level: Not on file  Occupational History  . Not on file  Tobacco Use  . Smoking status: Former Smoker    Packs/day: 0.50    Years: 4.00    Pack years: 2.00    Types: Cigarettes    Quit date: 07/16/2002    Years since quitting: 18.0  . Smokeless tobacco: Never Used  Vaping Use  . Vaping Use: Never used  Substance and Sexual Activity  . Alcohol use: Yes    Comment: social drinker  . Drug use: No  . Sexual activity: Not Currently  Other Topics Concern  . Not on file  Social History Narrative   05-29-18 Unable to ask abuse questions today wife with him.   Social Determinants of Health   Financial Resource Strain: Not on file  Food Insecurity: Not on file  Transportation Needs: Not  on file  Physical Activity: Not on file  Stress: Not on file  Social Connections: Not on file  Intimate Partner Violence: Not on file   Family History  Problem Relation Age of Onset  . Prostate cancer Brother      Vanessa Kick, MD 08/08/20 [phone]

## 2020-08-08 NOTE — ED Triage Notes (Signed)
Patient states he has had bright red bloody stools x 3 days and had a severe episode this am. Pt states he has also had a knot but it has gone down. Pt is aox4 and ambulatory.

## 2020-08-08 NOTE — Discharge Instructions (Addendum)
Begin using over the counter MIRALAX to soften your stools.

## 2020-11-09 ENCOUNTER — Ambulatory Visit: Payer: Medicare (Managed Care) | Admitting: Nurse Practitioner

## 2020-11-30 ENCOUNTER — Other Ambulatory Visit: Payer: Self-pay

## 2020-11-30 ENCOUNTER — Ambulatory Visit (INDEPENDENT_AMBULATORY_CARE_PROVIDER_SITE_OTHER): Payer: Medicare Other | Admitting: Nurse Practitioner

## 2020-11-30 ENCOUNTER — Encounter: Payer: Self-pay | Admitting: Nurse Practitioner

## 2020-11-30 VITALS — BP 120/60 | HR 80 | Temp 97.7°F | Wt 175.0 lb

## 2020-11-30 DIAGNOSIS — Z7289 Other problems related to lifestyle: Secondary | ICD-10-CM

## 2020-11-30 DIAGNOSIS — Z789 Other specified health status: Secondary | ICD-10-CM

## 2020-11-30 DIAGNOSIS — M25512 Pain in left shoulder: Secondary | ICD-10-CM | POA: Diagnosis not present

## 2020-11-30 DIAGNOSIS — G8929 Other chronic pain: Secondary | ICD-10-CM | POA: Diagnosis not present

## 2020-11-30 MED ORDER — NAPROXEN SODIUM 550 MG PO TABS: 550.0000 mg | ORAL_TABLET | Freq: Two times a day (BID) | ORAL | 1 refills | Status: DC

## 2020-11-30 NOTE — Progress Notes (Signed)
Subjective:  Patient ID: Ryan Wilson, male    DOB: March 18, 1961  Age: 60 y.o. MRN: [id number]  CC: Establish Care (New patient/ Pt c/o pain in left shoulder for years that goes into his chest. Pt states certain movements make it worse feels like something is pulling in his chest and arm. )  He is Left hand dominant. Retired Furniture conservator/restorer  Shoulder Pain  The pain is present in the left shoulder. This is a chronic problem. The current episode started more than 1 year ago. There has been no history of extremity trauma. The problem occurs constantly. The problem has been gradually worsening. The quality of the pain is described as aching. Associated symptoms include stiffness. Pertinent negatives include no fever, inability to bear weight, itching, joint locking, joint swelling, limited range of motion, numbness or tingling. The symptoms are aggravated by activity and lying down. He has tried nothing for the symptoms. Family history does not include gout or rheumatoid arthritis. His past medical history is significant for osteoarthritis. There is no history of diabetes, gout or rheumatoid arthritis.    Alcohol use Daily ETOH consumption 2x 12oz bottles of beer  per day and 2shots of liquor per week.  Advised to minimize ETOH use and not to stop use abruptly due to risk of withdrawal symptoms. Provided printed information of acceptable ETOH quantity. He verbalized understanding.   Reviewed past Medical, Social and Family history today.  Outpatient Medications Prior to Visit  Medication Sig Dispense Refill  . omeprazole (PRILOSEC) 40 MG capsule Take 1 capsule (40 mg total) by mouth daily. 90 capsule 3  . sildenafil (VIAGRA) 25 MG tablet Take 1 tablet (25 mg total) by mouth daily as needed for erectile dysfunction. 10 tablet 0  . ibuprofen (ADVIL) 800 MG tablet Take 1 tablet (800 mg total) by mouth 3 (three) times daily with meals. 21 tablet 0   No facility-administered medications prior to visit.     ROS See HPI  Objective:  BP 120/60 (BP Location: Left Arm, Patient Position: Sitting, Cuff Size: Large)   Pulse 80   Temp 97.7 F (36.5 C) (Temporal)   Wt 175 lb (79.4 kg)   SpO2 98%   BMI 25.84 kg/m   Physical Exam Vitals reviewed.  Neck:     Thyroid: No thyroid mass, thyromegaly or thyroid tenderness.  Cardiovascular:     Rate and Rhythm: Normal rate.     Pulses: Normal pulses.  Pulmonary:     Effort: Pulmonary effort is normal.  Chest:  Breasts:     Right: Normal.     Left: Normal. No axillary adenopathy or supraclavicular adenopathy.    Musculoskeletal:        General: Tenderness present. No swelling.     Right shoulder: Normal.     Left shoulder: Tenderness, bony tenderness and crepitus present. No swelling or effusion. Normal range of motion. Normal strength. Normal pulse.     Right upper arm: Normal.     Left upper arm: Normal.     Right elbow: Normal.     Left elbow: Normal.     Right forearm: Normal.     Left forearm: Normal.     Cervical back: Normal range of motion and neck supple.     Right lower leg: No edema.     Left lower leg: No edema.  Lymphadenopathy:     Cervical: No cervical adenopathy.     Upper Body:     Left upper body: No supraclavicular, axillary  or pectoral adenopathy.  Skin:    General: Skin is warm and dry.     Findings: No erythema or rash.  Neurological:     Mental Status: He is alert and oriented to person, place, and time.     Assessment & Plan:  This visit occurred during the SARS-CoV-2 public health emergency.  Safety protocols were in place, including screening questions prior to the visit, additional usage of staff PPE, and extensive cleaning of exam room while observing appropriate contact time as indicated for disinfecting solutions.   Ryan Wilson was seen today for establish care.  Diagnoses and all orders for this visit:  Chronic left shoulder pain -     DG Shoulder Left -     naproxen sodium (ANAPROX) 550 MG  tablet; Take 1 tablet (550 mg total) by mouth 2 (two) times daily with a meal.  Alcohol use   Problem List Items Addressed This Visit      Other   Alcohol use    Daily ETOH consumption 2x 12oz bottles of beer  per day and 2shots of liquor per week.  Advised to minimize ETOH use and not to stop use abruptly due to risk of withdrawal symptoms. Provided printed information of acceptable ETOH quantity. He verbalized understanding.       Other Visit Diagnoses    Chronic left shoulder pain    -  Primary   Relevant Medications   naproxen sodium (ANAPROX) 550 MG tablet   Other Relevant Orders   DG Shoulder Left      Follow-up: Return in about 2 weeks (around 12/14/2020) for CPE (fasting).  Wilfred Lacy, NP

## 2020-11-30 NOTE — Patient Instructions (Signed)
Thank you for choosing [facility].  Go to lab for x-ray Start naproxen as prescribed Do not take ay over the counter ibuprofen, aspirin, BC powder, naproxen, aleve, advil while taking medication given today. Do not take medication with any alcohol If you drink alcohol: ? Limit how much you use to: ? 0-1 drink a day for nonpregnant women. ? 0-2 drinks a day for men. ? Be aware of how much alcohol is in your drink. In the [place] one drink equals one 12 oz bottle of beer (355 mL), one 5 oz glass of wine (148 mL), or one 1 oz glass of hard liquor (44 mL).   Shoulder Pain Many things can cause shoulder pain, including:  An injury to the shoulder.  Overuse of the shoulder.  Arthritis. The source of the pain can be:  Inflammation.  An injury to the shoulder joint.  An injury to a tendon, ligament, or bone. Follow these instructions at home: Pay attention to changes in your symptoms. Let your health care provider know about them. Follow these instructions to relieve your pain. If you have a sling:  Wear the sling as told by your health care provider. Remove it only as told by your health care provider.  Loosen the sling if your fingers tingle, become numb, or turn cold and blue.  Keep the sling clean.  If the sling is not waterproof: ? Do not let it get wet. Remove it to shower or bathe.  Move your arm as little as possible, but keep your hand moving to prevent swelling. Managing pain, stiffness, and swelling  If directed, put ice on the painful area: ? Put ice in a plastic bag. ? Place a towel between your skin and the bag. ? Leave the ice on for 20 minutes, 2-3 times per day. Stop applying ice if it does not help with the pain.  Squeeze a soft ball or a foam pad as much as possible. This helps to keep the shoulder from swelling. It also helps to strengthen the arm.   General instructions  Take over-the-counter and prescription medicines only as told by your  health care provider.  Keep all follow-up visits as told by your health care provider. This is important. Contact a health care provider if:  Your pain gets worse.  Your pain is not relieved with medicines.  New pain develops in your arm, hand, or fingers. Get help right away if:  Your arm, hand, or fingers: ? Tingle. ? Become numb. ? Become swollen. ? Become painful. ? Turn white or blue. Summary  Shoulder pain can be caused by an injury, overuse, or arthritis.  Pay attention to changes in your symptoms. Let your health care provider know about them.  This condition may be treated with a sling, ice, and pain medicines.  Contact your health care provider if the pain gets worse or new pain develops. Get help right away if your arm, hand, or fingers tingle or become numb, swollen, or painful.  Keep all follow-up visits as told by your health care provider. This is important. This information is not intended to replace advice given to you by your health care provider. Make sure you discuss any questions you have with your health care provider. Document Revised: 01/27/2018 Document Reviewed: 01/27/2018 Elsevier Patient Education  2021 [employer].

## 2020-11-30 NOTE — Assessment & Plan Note (Signed)
Daily ETOH consumption 2x 12oz bottles of beer  per day and 2shots of liquor per week.  Advised to minimize ETOH use and not to stop use abruptly due to risk of withdrawal symptoms. Provided printed information of acceptable ETOH quantity. He verbalized understanding.

## 2020-12-01 ENCOUNTER — Ambulatory Visit (INDEPENDENT_AMBULATORY_CARE_PROVIDER_SITE_OTHER)
Admission: RE | Admit: 2020-12-01 | Discharge: 2020-12-01 | Disposition: A | Discharge status: Home / Self Care | Payer: Medicare Other | Source: Ambulatory Visit | Attending: Nurse Practitioner | Admitting: Nurse Practitioner

## 2020-12-01 DIAGNOSIS — G8929 Other chronic pain: Secondary | ICD-10-CM

## 2020-12-01 DIAGNOSIS — M25512 Pain in left shoulder: Secondary | ICD-10-CM

## 2020-12-21 ENCOUNTER — Telehealth: Payer: Self-pay

## 2020-12-21 ENCOUNTER — Ambulatory Visit: Payer: Medicare (Managed Care) | Admitting: Nurse Practitioner

## 2020-12-21 NOTE — Telephone Encounter (Signed)
Patient is calling in stating he hasnt heard from [place] about his x-ray results,advised patient we tried contacting him but it wasn't successful but that the x-rays are normal. Ryan Wilson states that he wants to know what the next steps will be because the pain is still come and go.

## 2020-12-22 NOTE — Telephone Encounter (Signed)
Enter referral to sports medicine. He should continue naproxen as prescribed

## 2020-12-26 ENCOUNTER — Other Ambulatory Visit: Payer: Self-pay

## 2020-12-26 DIAGNOSIS — G8929 Other chronic pain: Secondary | ICD-10-CM

## 2020-12-26 DIAGNOSIS — M25512 Pain in left shoulder: Secondary | ICD-10-CM

## 2021-01-03 ENCOUNTER — Ambulatory Visit: Payer: Self-pay

## 2021-01-03 ENCOUNTER — Ambulatory Visit: Payer: Medicare Other | Admitting: Family Medicine

## 2021-01-03 ENCOUNTER — Ambulatory Visit (INDEPENDENT_AMBULATORY_CARE_PROVIDER_SITE_OTHER): Payer: Medicare Other

## 2021-01-03 ENCOUNTER — Other Ambulatory Visit: Payer: Self-pay

## 2021-01-03 VITALS — BP 114/78 | HR 76 | Ht 69.0 in | Wt 175.8 lb

## 2021-01-03 DIAGNOSIS — G8929 Other chronic pain: Secondary | ICD-10-CM

## 2021-01-03 DIAGNOSIS — M25512 Pain in left shoulder: Secondary | ICD-10-CM

## 2021-01-03 DIAGNOSIS — M79642 Pain in left hand: Secondary | ICD-10-CM | POA: Diagnosis not present

## 2021-01-03 DIAGNOSIS — M25562 Pain in left knee: Secondary | ICD-10-CM

## 2021-01-03 DIAGNOSIS — M25561 Pain in right knee: Secondary | ICD-10-CM

## 2021-01-03 DIAGNOSIS — M79641 Pain in right hand: Secondary | ICD-10-CM

## 2021-01-03 NOTE — Patient Instructions (Addendum)
Thank you for coming in today.  I've referred you to Physical Therapy.  Let us know if you don't hear from them in one week.  Please use Voltaren gel (Generic Diclofenac Gel) up to 4x daily for pain as needed.  This is available over-the-counter as both the name brand Voltaren gel and the generic diclofenac gel.  Recheck with me in 6 weeks.   Please get an Xray today before you leave

## 2021-01-03 NOTE — Progress Notes (Signed)
I, Peterson Lombard, LAT, ATC acting as a scribe for Lynne Leader, MD.  Subjective:    I'm seeing this patient as a consultation for: Dr. Wilfred Lacy. Note will be routed back to referring provider/PCP.  CC: Left shoulder and bilat knee pain  HPI: Pt is a 60 y/o male c/o L shoulder ongoing for years. Pt is L-hand dominate and is a retired Furniture conservator/restorer. Pt locates pain to anterior aspect of the L side of his chest.  Neck pain: no Radiates: yes- into chest Mechanical symptoms: no UE numbness/tingling: no UE weakness: sometimes Aggravates: trunk rotation Treatments tried: naproxen  Pt also c/o bilat knee pain ongoing for awhile. Pt locates pain to anterior aspect of bilat knees.  Knee swelling: sometimes Mechanical symptoms: yes Aggravates: knee flexion (squatting) Treatments tried: creams, all natural capsuls  Additionally he notes bilateral thumb pain  Dx imaging: 12/01/20 L shoulder XR  12/14/16 C-spine XR  04/21/16 L-spine XR  04/21/16 C-spine XR  Past medical history, Surgical history, Family history, Social history, Allergies, and medications have been entered into the medical record, reviewed.  .  History of prostate cancer  Review of Systems: No new headache, visual changes, nausea, vomiting, diarrhea, constipation, dizziness, abdominal pain, skin rash, fevers, chills, night sweats, weight loss, swollen lymph nodes, body aches, joint swelling, muscle aches, chest pain, shortness of breath, mood changes, visual or auditory hallucinations.   Objective:    Vitals:   01/03/21 1505  BP: 114/78  Pulse: 76  SpO2: 98%   General: Well Developed, well nourished, and in no acute distress.  Neuro/Psych: Alert and oriented x3, extra-ocular muscles intact, able to move all 4 extremities, sensation grossly intact. Skin: Warm and dry, no rashes noted.  Respiratory: Not using accessory muscles, speaking in full sentences, trachea midline.  Cardiovascular: Pulses palpable, no extremity  edema. Abdomen: Does not appear distended. MSK:  Left shoulder normal.  Nontender normal shoulder motion.  Intact strength.  Negative impingement testing  Knees bilaterally mild effusion normal motion crepitation.  Stable ligamentous exam. Intact strength.  Thumbs bilaterally tender palpation  Lab and Radiology Results  Diagnostic Limited MSK Ultrasound of: Left shoulder Biceps tendon intact normal-appearing Subscapularis tendon is intact normal. Supraspinatus tendon is intact. Mild subacromial bursitis present Infraspinatus tendon is intact. AC joint mild effusion. Impression: Mild subacromial bursitis   X-ray images bilateral knees and bilateral hands obtained today personally and independently interpreted  Right knee: Moderate patellofemoral DJD.  No acute fractures.  No aggressive appearing bone lesions.  Left knee: Mild patellofemoral DJD.  No acute fractures.  No aggressive appearing bone lesions.  Right hand: Severe first CMC DJD.  No acute fractures.  No aggressive appearing bone lesions.  Left hand: Severe first CMC DJD.  No acute fractures.  No aggressive appearing bone lesions.  Await formal radiology review  EXAM: LEFT SHOULDER - 2+ VIEW  COMPARISON:  None.  FINDINGS: No acute fracture or dislocation is noted. No soft tissue abnormality is seen. The underlying bony thorax is within normal limits.  IMPRESSION: No acute abnormality noted.   Electronically Signed   By: Inez Catalina M.D.   On: 12/03/2020 22:55  I, Lynne Leader, personally (independently) visualized and performed the interpretation of the images attached in this note.   Impression and Recommendations:    Assessment and Plan: 60 y.o. male with left anterior shoulder to pectoralis shoulder pain.  This is a chronic issue.  I think it is more muscle shoulder girdle related than true shoulder  pain.  He is a good candidate for trial of physical therapy.  Plan for referral to PT and  reassess in 6 weeks..  Bilateral knee pain.  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  This is certainly a source of some of his knee pain.  I think he is a good candidate also for PT especially for quadricep strengthening.  Additionally recommend Voltaren gel.  Certainly steroid injection is reasonable upon return.  Patient would like to avoid injections today if possible.  Thumb pain: This was a somewhat back burner issue today we will get more attention at the next visit.  However based on x-ray today think the most likely source of pain is probably the severe DJD seen at first CMC's bilateral.  Again Voltaren gel and injections in the future if needed.  X-rays obtained given his prostate cancer history.  No signs of aggressive appearing lesions on x-rays per my read however radiology overread is still pending.  PDMP not reviewed this encounter. Orders Placed This Encounter  Procedures  . [place] LIMITED JOINT SPACE STRUCTURES UP LEFT(NO LINKED CHARGES)    Standing Status:   Future    Number of Occurrences:   1    Standing Expiration Date:   07/05/2021    Order Specific Question:   Reason for Exam (SYMPTOM  OR DIAGNOSIS REQUIRED)    Answer:   left shoulder pain    Order Specific Question:   Preferred imaging location?    Answer:   [place]  . [place] LIMITED JOINT SPACE STRUCTURES LOW BILAT(NO LINKED CHARGES)    Standing Status:   Future    Number of Occurrences:   1    Standing Expiration Date:   07/05/2021    Order Specific Question:   Reason for Exam (SYMPTOM  OR DIAGNOSIS REQUIRED)    Answer:   bilateral knee pain    Order Specific Question:   Preferred imaging location?    Answer:   [place]  . DG Knee AP/LAT W/[place] Left    Standing Status:   Future    Number of Occurrences:   1    Standing Expiration Date:   01/03/2022    Order Specific Question:   Reason for Exam (SYMPTOM  OR DIAGNOSIS  REQUIRED)    Answer:   bilateral knee pain    Order Specific Question:   Preferred imaging location?    Answer:   [facility]  . DG Knee AP/LAT W/[place] Right    Standing Status:   Future    Number of Occurrences:   1    Standing Expiration Date:   01/03/2022    Order Specific Question:   Reason for Exam (SYMPTOM  OR DIAGNOSIS REQUIRED)    Answer:   bilateral knee pain    Order Specific Question:   Preferred imaging location?    Answer:   [facility]  . DG Hand Complete Right    Standing Status:   Future    Number of Occurrences:   1    Standing Expiration Date:   01/03/2022    Order Specific Question:   Reason for Exam (SYMPTOM  OR DIAGNOSIS REQUIRED)    Answer:   hand pain    Order Specific Question:   Preferred imaging location?    Answer:   [facility]  . DG Hand Complete Left    Standing Status:   Future    Number of Occurrences:  1    Standing Expiration Date:   01/03/2022    Order Specific Question:   Reason for Exam (SYMPTOM  OR DIAGNOSIS REQUIRED)    Answer:   eval hand pain    Order Specific Question:   Preferred imaging location?    Answer:   [facility]  . Ambulatory referral to Physical Therapy    Referral Priority:   Routine    Referral Type:   Physical Medicine    Referral Reason:   Specialty Services Required    Requested Specialty:   Physical Therapy   No orders of the defined types were placed in this encounter.   Discussed warning signs or symptoms. Please see discharge instructions. Patient expresses understanding.   The above documentation has been reviewed and is accurate and complete Lynne Leader, M.D.

## 2021-01-08 NOTE — Progress Notes (Signed)
Attempted to call pt however he did not answer and does not have his VM set up.

## 2021-01-08 NOTE — Progress Notes (Signed)
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 Some arthritis changes also present elsewhere in the hand.

## 2021-01-08 NOTE — Progress Notes (Signed)
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  Arthritis also present elsewhere in the hand

## 2021-01-08 NOTE — Progress Notes (Signed)
Left knee x-ray shows mild arthritis.

## 2021-01-08 NOTE — Progress Notes (Signed)
Right knee x-ray shows medium arthritis.  Old appearing injury present in the knee as well.  Small bone fragments could represent an avulsion fracture where the ligament was pulled loose from the kneecap.

## 2021-01-17 ENCOUNTER — Ambulatory Visit: Payer: Medicare Other | Admitting: Rehabilitative and Restorative Service Providers"

## 2021-02-05 ENCOUNTER — Ambulatory Visit: Payer: Medicare Other | Admitting: Physical Therapy

## 2021-02-05 ENCOUNTER — Encounter: Payer: Self-pay | Admitting: Physical Therapy

## 2021-02-05 ENCOUNTER — Other Ambulatory Visit: Payer: Self-pay

## 2021-02-05 DIAGNOSIS — M25561 Pain in right knee: Secondary | ICD-10-CM | POA: Diagnosis not present

## 2021-02-05 DIAGNOSIS — M25662 Stiffness of left knee, not elsewhere classified: Secondary | ICD-10-CM | POA: Diagnosis not present

## 2021-02-05 DIAGNOSIS — G8929 Other chronic pain: Secondary | ICD-10-CM

## 2021-02-05 DIAGNOSIS — M25512 Pain in left shoulder: Secondary | ICD-10-CM | POA: Diagnosis not present

## 2021-02-05 DIAGNOSIS — M25562 Pain in left knee: Secondary | ICD-10-CM

## 2021-02-05 DIAGNOSIS — R262 Difficulty in walking, not elsewhere classified: Secondary | ICD-10-CM

## 2021-02-05 DIAGNOSIS — M25661 Stiffness of right knee, not elsewhere classified: Secondary | ICD-10-CM

## 2021-02-05 NOTE — Therapy (Signed)
[facility] [address] [place], [place], [address] Phone: [phone]   Fax:  [phone]  Physical Therapy Evaluation  Patient Details  Name: Ryan Wilson MRN: [id number] Date of Birth: Oct 25, 1960 Referring Provider (PT): Lynne Leader, MD   Encounter Date: 02/05/2021   PT End of Session - 02/05/21 1609     Visit Number 1    Number of Visits 8    Date for PT Re-Evaluation 04/06/21    Progress Note Due on Visit --    PT Start Time 9528    PT Stop Time 1612    PT Time Calculation (min) 38 min    Activity Tolerance Patient tolerated treatment well    Behavior During Therapy [facility] for tasks assessed/performed             Past Medical History:  Diagnosis Date   Back pain    Heart murmur    PER PATIENT WAS HEARD BY A NURSE; DENIES SYMPTOMS     Inguinal hernia    REPAIRED  IN 1990s   Prostate cancer [facility])     Past Surgical History:  Procedure Laterality Date   [place] Bilateral 07/18/2017   Procedure: LIMITED PELVIC LYMPH NODE DISSECTION;  Surgeon: Alexis Frock, MD;  Location: [facility];  Service: Urology;  Laterality: Bilateral;   PROSTATE BIOPSY     ROBOT ASSISTED LAPAROSCOPIC RADICAL PROSTATECTOMY N/A 07/18/2017   Procedure: XI ROBOTIC ASSISTED LAPAROSCOPIC RADICAL PROSTATECTOMY, RIGHT INGUINAL HERNIA REPAIR;  Surgeon: Alexis Frock, MD;  Location: [facility];  Service: Urology;  Laterality: N/A;    There were no vitals filed for this visit.    Subjective Assessment - 02/05/21 1538     Subjective Pt arriving reporting left shoulder pain/pectoralis pain and bilateral knee pain. Pt reporting he does not want any injections. Pt reproting pain after prolonged walking in his knees and pain intermittently in his left shoulder/pectoralis.    Pertinent History prostate cancer, heart murmur    Patient Stated Goals stop hurting    Currently in Pain? Yes    Pain Score 6      Pain Location Shoulder    Pain Orientation Left    Pain Descriptors / Indicators Sharp    Pain Type Chronic pain    Pain Onset More than a month ago    Pain Frequency Intermittent    Aggravating Factors  reaching backward, random movements    Pain Relieving Factors gentle movements    Effect of Pain on Daily Activities it stops me from what I'm doing until it relaxes    Multiple Pain Sites Yes    Pain Score 7    Pain Location Knee    Pain Orientation Left;Right    Pain Descriptors / Indicators Aching    Pain Type Chronic pain    Pain Onset More than a month ago    Pain Frequency Constant    Aggravating Factors  bending, squatting    Effect of Pain on Daily Activities difficulty bending                [facility] PT Assessment - 02/05/21 0001       Assessment   Medical Diagnosis left shoulder pain, bilateral knee pain    Referring Provider (PT) Lynne Leader, MD    Hand Dominance Left    Prior Therapy f/u after therapy or as needed per pt report  Precautions   Precautions None      Balance Screen   Has the patient fallen in the past 6 months No    Is the patient reluctant to leave their home because of a fear of falling?  No      Home Environment   Living Environment Private residence    Living Arrangements Spouse/significant other    Type of [place] to enter    Entrance Stairs-Number of Steps 2    [place] One level    [place] bars - tub/shower      Prior Function   Level of Independence Independent    Vocation On disability      Cognition   Overall Cognitive Status Within Functional Limits for tasks assessed      Observation/Other Assessments   Focus on Therapeutic Outcomes (FOTO)  deferred due multiple joint involvement      Posture/Postural Control   Posture/Postural Control Postural limitations    Postural Limitations Rounded Shoulders;Forward head      ROM / Strength   AROM / PROM / Strength AROM;Strength       AROM   AROM Assessment Site Shoulder;Knee    Right/Left Shoulder Right;Left    Right Shoulder Flexion 160 Degrees    Right Shoulder ABduction 158 Degrees    Right Shoulder Internal Rotation 70 Degrees   shoulder abd 90 degrees   Right Shoulder External Rotation 90 Degrees   shoulder abd 90 degrees   Left Shoulder Extension 40 Degrees    Left Shoulder Flexion 140 Degrees    Left Shoulder ABduction 145 Degrees    Left Shoulder Internal Rotation 40 Degrees   shoulder abd 90 degrees   Left Shoulder External Rotation 90 Degrees   shoulder abd to 90 degrees   Right/Left Knee Right;Left    Right Knee Extension 0    Right Knee Flexion 135    Left Knee Extension 0   pain noted   Left Knee Flexion 122   pain noted     Strength   Overall Strength Comments pain noted with left shoulder MMT in flexion, abduction and IR/ER    Strength Assessment Site Knee;Shoulder    Right/Left Shoulder Right;Left    Right Shoulder Flexion 5/5    Right Shoulder Extension 5/5    Right Shoulder ABduction 5/5    Right Shoulder Internal Rotation 5/5    Right Shoulder External Rotation 5/5    Left Shoulder Flexion 4+/5    Left Shoulder Extension 4+/5    Left Shoulder ABduction 4+/5    Left Shoulder Internal Rotation 4+/5    Left Shoulder External Rotation 4+/5    Right/Left Knee Right;Left    Right Knee Flexion 4/5    Right Knee Extension 4/5    Left Knee Flexion 4-/5    Left Knee Extension 4-/5      Palpation   Palpation comment TTP pectoralis major, anterior shoulder joint      Transfers   Five time sit to stand comments  26 seconds with UE support      Ambulation/Gait   Assistive device None    Gait Pattern Step-through pattern;Decreased hip/knee flexion - left                        Objective measurements completed on examination: See above findings.       [place] Adult PT Treatment/Exercise - 02/05/21 0001  Exercises   Exercises Shoulder;Knee/Hip      Knee/Hip  Exercises: Supine   Bridges Strengthening;Both;10 reps    Bridges Limitations holding 5 seconds    Straight Leg Raises Strengthening;Both;5 reps      Shoulder Exercises: Supine   Flexion AAROM;Left;10 reps      Shoulder Exercises: Stretch   Corner Stretch 3 reps;10 seconds    Other Shoulder Stretches IR with towel x 3 holding 10 seconds each                    PT Education - 02/05/21 1608     Education Details PT POC, HEP    Person(s) Educated Patient    Methods Explanation;Demonstration;Tactile cues;Verbal cues;Handout    Comprehension Verbal cues required;Returned demonstration;Verbalized understanding              PT Short Term Goals - 02/05/21 1636       PT SHORT TERM GOAL #1   Title Pt will be independent in his initial HEP.    Time 3    Period Weeks    Status New    Target Date 03/02/21      PT SHORT TERM GOAL #2   Title Pt will be able to improve his 5 time sit to stand to </= 14 seconds with UE support.    Baseline 26 seconds with UE support    Time 4    Period Weeks    Status New    Target Date 03/09/21               PT Long Term Goals - 02/05/21 1637       PT LONG TERM GOAL #1   Title Pt will be independent in his advanced HEP.    Time 8    Period Weeks    Status New    Target Date 04/06/21      PT LONG TERM GOAL #2   Title Pt will be able to improve his Left shoulder IR to >/= 65 degrees to improve functional mobility.    Baseline see flowsheets    Time 8    Period Weeks    Status New    Target Date 04/06/21      PT LONG TERM GOAL #3   Title Pt will improve his left shoulder fleixon to >/= 160 degrees with pain </= 2/10.    Time 8    Period Weeks    Status New    Target Date 04/06/21      PT LONG TERM GOAL #4   Title Pt will improve his left knee strength to >/= 4+/5 for improvements in funcitonal mobilty.    Time 8    Period Weeks    Status New    Target Date 04/06/21      PT LONG TERM GOAL #5   Title Pt will   be able to report lifting >/= 10 # with no pain reported in his left shoulder.    Time 8    Period Weeks    Status New    Target Date 04/06/21                    Plan - 02/05/21 1641     Clinical Impression Statement Pt arriving to therapy for physical therapy evaluation for chronic left shoulder pain and chronic bilateral knee pain. Pt reporting pain from 5-7/10. Pt presenting with pain, deficits in shoulder ROM on the left and weakness in left  shoulder and bilateral knees. Pt was issued a HEP and was able to return demonstration with verbal cues required for technique. Skilled PT needed to address impairments with below interventions.    Personal Factors and Comorbidities Comorbidity 2;Other    Comorbidities prostate cancer, hernia repair, pelvic lymph node dissection, prostateectomy    Examination-Activity Limitations Carry;Lift;Other;Squat;Stairs;Stand    Examination-Participation Restrictions Community Activity    Stability/Clinical Decision Making Stable/Uncomplicated    Clinical Decision Making Moderate    Rehab Potential Good    PT Frequency 1x / week    PT Duration 8 weeks    PT Treatment/Interventions ADLs/Self Care Home Management;Cryotherapy;Electrical Stimulation;Iontophoresis 4mg /ml Dexamethasone;Moist Heat;Ultrasound;Balance training;Therapeutic exercise;Therapeutic activities;Functional mobility training;Stair training;Gait training;Neuromuscular re-education;Patient/family education;Passive range of motion;Manual techniques;Dry needling;Taping;Joint Manipulations    PT Next Visit Plan Nustep, bilateral LE strengthening, shoulder ROM (IR), pect stretches,  and strengthening    PT Home Exercise Plan Access Code: [facility]  URL: [email or web address]  Date: 02/05/2021  Prepared by: Kearney Hard    Exercises  Supine Shoulder Flexion Extension AAROM with Dowel - 2-3 x daily - 7 x weekly - 2 sets - 10 reps  Supine Bridge - 2-3 x daily - 7 x weekly - 2 sets -  10 reps  Supine Active Straight Leg Raise - 2-3 x daily - 7 x weekly - 2 sets - 10 reps  Corner Pec Major Stretch - 2-3 x daily - 7 x weekly - 3 reps - 20-30 seconds hold  Standing Shoulder Internal Rotation Stretch with Towel - 2-3 x daily - 7 x weekly - 5-10 reps - 10 seconds hold    Consulted and Agree with Plan of Care Patient             Patient will benefit from skilled therapeutic intervention in order to improve the following deficits and impairments:  Pain, Decreased mobility, Decreased strength, Postural dysfunction, Decreased activity tolerance, Difficulty walking, Impaired UE functional use, Impaired flexibility, Decreased range of motion  Visit Diagnosis: Chronic left shoulder pain  Chronic pain of left knee  Chronic pain of right knee  Stiffness of left knee, not elsewhere classified  Stiffness of right knee, not elsewhere classified  Difficulty in walking, not elsewhere classified     Problem List Patient Active Problem List   Diagnosis Date Noted   Right knee pain 02/17/2019   Lower back pain 02/17/2019   Health care maintenance 06/18/2018   Gastroesophageal reflux disease 06/18/2018   Erectile disorder due to medical condition in male 06/18/2018   Alcohol use 06/18/2018   Prostate cancer ([place]) 07/18/2017    Oretha Caprice, PT, MPT 02/05/2021, 4:56 PM  [facility] [address] [place], [place], [address] Phone: [phone]   Fax:  [phone]  Name: Ryan Wilson MRN: [id number] Date of Birth: 1961/03/21

## 2021-02-05 NOTE — Patient Instructions (Signed)
Access Code: [email or web address]  URL: [email or web address]  Date: 02/05/2021  Prepared by: Kearney Hard    Exercises  Supine Shoulder Flexion Extension AAROM with Dowel - 2-3 x daily - 7 x weekly - 2 sets - 10 reps  Supine Bridge - 2-3 x daily - 7 x weekly - 2 sets - 10 reps  Supine Active Straight Leg Raise - 2-3 x daily - 7 x weekly - 2 sets - 10 reps  Corner Pec Major Stretch - 2-3 x daily - 7 x weekly - 3 reps - 20-30 seconds hold  Standing Shoulder Internal Rotation Stretch with Towel - 2-3 x daily - 7 x weekly - 5-10 reps - 10 seconds hold

## 2021-02-14 ENCOUNTER — Other Ambulatory Visit: Payer: Self-pay

## 2021-02-14 ENCOUNTER — Ambulatory Visit: Payer: Medicare Other | Admitting: Family Medicine

## 2021-02-14 ENCOUNTER — Encounter: Payer: Self-pay | Admitting: Physical Therapy

## 2021-02-14 ENCOUNTER — Ambulatory Visit: Payer: Medicare Other | Admitting: Physical Therapy

## 2021-02-14 VITALS — BP 102/78 | HR 85 | Ht 69.0 in | Wt 171.8 lb

## 2021-02-14 DIAGNOSIS — M79641 Pain in right hand: Secondary | ICD-10-CM | POA: Diagnosis not present

## 2021-02-14 DIAGNOSIS — M25662 Stiffness of left knee, not elsewhere classified: Secondary | ICD-10-CM

## 2021-02-14 DIAGNOSIS — M25561 Pain in right knee: Secondary | ICD-10-CM

## 2021-02-14 DIAGNOSIS — M25512 Pain in left shoulder: Secondary | ICD-10-CM

## 2021-02-14 DIAGNOSIS — M25562 Pain in left knee: Secondary | ICD-10-CM | POA: Diagnosis not present

## 2021-02-14 DIAGNOSIS — G8929 Other chronic pain: Secondary | ICD-10-CM

## 2021-02-14 DIAGNOSIS — R262 Difficulty in walking, not elsewhere classified: Secondary | ICD-10-CM

## 2021-02-14 DIAGNOSIS — M25661 Stiffness of right knee, not elsewhere classified: Secondary | ICD-10-CM

## 2021-02-14 DIAGNOSIS — M79642 Pain in left hand: Secondary | ICD-10-CM

## 2021-02-14 NOTE — Therapy (Addendum)
[facility] [address] [place], [place], [address] Phone: [phone]   Fax:  [phone]  Physical Therapy Treatment/Discharge Summary  Patient Details  Name: Ryan Wilson MRN: [id number] Date of Birth: November 07, 1960 Referring Provider (PT): Lynne Leader, MD   Encounter Date: 02/14/2021   PT End of Session - 02/14/21 0835     Visit Number 2    Number of Visits 8    Date for PT Re-Evaluation 04/06/21    PT Start Time 0800    PT Stop Time 0838    PT Time Calculation (min) 38 min    Activity Tolerance Patient tolerated treatment well    Behavior During Therapy [facility] for tasks assessed/performed             Past Medical History:  Diagnosis Date   Back pain    Heart murmur    PER PATIENT WAS HEARD BY A NURSE; DENIES SYMPTOMS     Inguinal hernia    REPAIRED  IN 1990s   Prostate cancer [facility])     Past Surgical History:  Procedure Laterality Date   [place] LYMPH NODE DISSECTION Bilateral 07/18/2017   Procedure: LIMITED PELVIC LYMPH NODE DISSECTION;  Surgeon: Alexis Frock, MD;  Location: [facility];  Service: Urology;  Laterality: Bilateral;   PROSTATE BIOPSY     ROBOT ASSISTED LAPAROSCOPIC RADICAL PROSTATECTOMY N/A 07/18/2017   Procedure: XI ROBOTIC ASSISTED LAPAROSCOPIC RADICAL PROSTATECTOMY, RIGHT INGUINAL HERNIA REPAIR;  Surgeon: Alexis Frock, MD;  Location: [facility];  Service: Urology;  Laterality: N/A;    There were no vitals filed for this visit.   Subjective Assessment - 02/14/21 0759     Subjective brought turmeric in today to show [place], he feels this is helping    Pertinent History prostate cancer, heart murmur    Patient Stated Goals stop hurting    Currently in Pain? Yes    Pain Score 0-No pain   had some pain yesterday in shoulder - pulling in pecs   Pain Location Shoulder    Pain Orientation Left    Pain Onset More than a month ago    Pain Score 7    Pain Location Knee     Pain Orientation Left;Right    Pain Descriptors / Indicators Aching    Pain Type Chronic pain    Pain Onset More than a month ago    Pain Frequency Constant    Aggravating Factors  bending, squatting    Pain Relieving Factors turmeric                               [facility] Adult PT Treatment/Exercise - 02/14/21 0803       Knee/Hip Exercises: Aerobic   Nustep UE/LE L6 x 10 min      Knee/Hip Exercises: Seated   Other Seated Knee/Hip Exercises seated SLR 2x10 bil      Knee/Hip Exercises: Supine   Bridges Both;2 sets;10 reps    Bridges Limitations holding 5 seconds    Straight Leg Raises Both;2 sets;10 reps    Other Supine Knee/Hip Exercises hooklying SL clamshell 2x10 bil with L3 band      Shoulder Exercises: Supine   Flexion AAROM;20 reps   1# bar     Shoulder Exercises: Stretch   Corner Stretch 3 reps;30 seconds    Corner Stretch Limitations mid level doorway    Other  Shoulder Stretches IR with towel x 5 holding 10 seconds each                    PT Education - 02/14/21 0834     Education Details updated HEP    Person(s) Educated Patient    Methods Explanation;Demonstration;Handout    Comprehension Verbalized understanding;Returned demonstration;Need further instruction              PT Short Term Goals - 02/05/21 1636       PT SHORT TERM GOAL #1   Title Pt will be independent in his initial HEP.    Time 3    Period Weeks    Status New    Target Date 03/02/21      PT SHORT TERM GOAL #2   Title Pt will be able to improve his 5 time sit to stand to </= 14 seconds with UE support.    Baseline 26 seconds with UE support    Time 4    Period Weeks    Status New    Target Date 03/09/21               PT Long Term Goals - 02/05/21 1637       PT LONG TERM GOAL #1   Title Pt will be independent in his advanced HEP.    Time 8    Period Weeks    Status New    Target Date 04/06/21      PT LONG TERM GOAL #2   Title Pt will  be able to improve his Left shoulder IR to >/= 65 degrees to improve functional mobility.    Baseline see flowsheets    Time 8    Period Weeks    Status New    Target Date 04/06/21      PT LONG TERM GOAL #3   Title Pt will improve his left shoulder fleixon to >/= 160 degrees with pain </= 2/10.    Time 8    Period Weeks    Status New    Target Date 04/06/21      PT LONG TERM GOAL #4   Title Pt will improve his left knee strength to >/= 4+/5 for improvements in funcitonal mobilty.    Time 8    Period Weeks    Status New    Target Date 04/06/21      PT LONG TERM GOAL #5   Title Pt will  be able to report lifting >/= 10 # with no pain reported in his left shoulder.    Time 8    Period Weeks    Status New    Target Date 04/06/21                   Plan - 02/14/21 0835     Clinical Impression Statement Pt needed min cues for HEP review and requested additional exercises for HEP today so provided new exercises to program.  No goals met as only 2nd visit, and does feel he is improving from a pain and function standpoint at this time.    Personal Factors and Comorbidities Comorbidity 2;Other    Comorbidities prostate cancer, hernia repair, pelvic lymph node dissection, prostateectomy    Examination-Activity Limitations Carry;Lift;Other;Squat;Stairs;Stand    Examination-Participation Restrictions Community Activity    Stability/Clinical Decision Making Stable/Uncomplicated    Rehab Potential Good    PT Frequency 1x / week    PT Duration 8 weeks  PT Treatment/Interventions ADLs/Self Care Home Management;Cryotherapy;Electrical Stimulation;Iontophoresis 86m/ml Dexamethasone;Moist Heat;Ultrasound;Balance training;Therapeutic exercise;Therapeutic activities;Functional mobility training;Stair training;Gait training;Neuromuscular re-education;Patient/family education;Passive range of motion;Manual techniques;Dry needling;Taping;Joint Manipulations    PT Next Visit Plan Nustep,  bilateral LE strengthening, shoulder ROM (IR), pect stretches,  and strengthening, review updated HEP    PT Home Exercise Plan [email or web address] URL: [email or web address]  Date: 02/05/2021  Prepared by: JKearney Hard   Exercises  Supine Shoulder Flexion Extension AAROM with Dowel - 2-3 x daily - 7 x weekly - 2 sets - 10 reps  Supine Bridge - 2-3 x daily - 7 x weekly - 2 sets - 10 reps  Supine Active Straight Leg Raise - 2-3 x daily - 7 x weekly - 2 sets - 10 reps  Corner Pec Major Stretch - 2-3 x daily - 7 x weekly - 3 reps - 20-30 seconds hold  Standing Shoulder Internal Rotation Stretch with Towel - 2-3 x daily - 7 x weekly - 5-10 reps - 10 seconds hold    Consulted and Agree with Plan of Care Patient             Patient will benefit from skilled therapeutic intervention in order to improve the following deficits and impairments:  Pain, Decreased mobility, Decreased strength, Postural dysfunction, Decreased activity tolerance, Difficulty walking, Impaired UE functional use, Impaired flexibility, Decreased range of motion  Visit Diagnosis: Chronic left shoulder pain  Chronic pain of left knee  Chronic pain of right knee  Stiffness of left knee, not elsewhere classified  Stiffness of right knee, not elsewhere classified  Difficulty in walking, not elsewhere classified     Problem List Patient Active Problem List   Diagnosis Date Noted   Right knee pain 02/17/2019   Lower back pain 02/17/2019   Health care maintenance 06/18/2018   Gastroesophageal reflux disease 06/18/2018   Erectile disorder due to medical condition in male 06/18/2018   Alcohol use 06/18/2018   [place]      SLaureen Abrahams PT, DPT 02/14/21 8:38 AM     [facility] [address] [address]Phone: [phone]  Fax:  [phone] Name: Ryan McclintockMRN: [id number] of Birth:  9June 10, 1962    PHYSICAL THERAPY DISCHARGE SUMMARY  Visits from Start of Care: 2  Current functional level related to goals / functional outcomes: See above   Remaining deficits: See above/unknown   Education / Equipment: HEP   Patient agrees to discharge. Patient goals were not met. Patient is being discharged due to not returning since the last visit.  SLaureen Abrahams PT, DPT 05/14/21 12:11 PM  [facility] [address] [place] [address]Phone: [phone]  Fax:  [phone]

## 2021-02-14 NOTE — Progress Notes (Signed)
I, Wendy Poet, LAT, ATC, am serving as scribe for Dr. Lynne Leader.  Ryan Wilson is a 60 y.o. male who presents to [place] at [facility] today for f/u of L shoulder, B knee pain and thumb pain.  He was last seen by Dr. Georgina Snell on 01/03/21 and was referred to PT of which he's completed 2 sessions.  Since his last visit, pt reports 70-80% improvement in his pain. Pt c/o L sided chest/pec pain is most bothersome. Pt is also interested in switching to a more conveniently located PCP, and was provided a list of the providers upstairs excepting new patients by AT.  Diagnostic imaging: R and L hand XR- 01/03/21; R and L knee XR- 01/03/21; L shoulder XR- 12/01/20  Pertinent review of systems: No fevers or chills  Relevant historical information: History of prostate cancer   Exam:  BP 102/78 (BP Location: Right Arm, Patient Position: Sitting, Cuff Size: Normal)   Pulse 85   Ht 5\' 9"  (1.753 m)   Wt 171 lb 12.8 oz (77.9 kg)   SpO2 98%   BMI 25.37 kg/m  General: Well Developed, well nourished, and in no acute distress.   MSK: Left shoulder normal.  Normal motion. Bilateral hands bossing and swelling at first [facility]. Knees bilaterally normal.  Normal motion with crepitation.    Lab and Radiology Results DG Shoulder Left  Result Date: 12/03/2020 CLINICAL DATA:  Chronic left shoulder pain, no known injury, initial encounter EXAM: LEFT SHOULDER - 2+ VIEW COMPARISON:  None. FINDINGS: No acute fracture or dislocation is noted. No soft tissue abnormality is seen. The underlying bony thorax is within normal limits. IMPRESSION: No acute abnormality noted. Electronically Signed   By: Inez Catalina M.D.   On: 12/03/2020 22:55   DG Hand Complete Left  Result Date: 01/06/2021 CLINICAL DATA:  chronic generalized bilateral hand pain EXAM: RIGHT HAND - COMPLETE 3+ VIEW; LEFT HAND - COMPLETE 3+ VIEW COMPARISON:  None. FINDINGS: Right hand: No evidence of acute fracture. There is severe first  carpometacarpal joint degenerative change. Scattered mild interphalangeal joint degenerative change. Subchondral cystic or erosive change at the second and third MCP joints. No periostitis or chondrocalcinosis. Left hand: No evidence of acute fracture. Severe first carpometacarpal joint degenerative change. There is flexion at the and fifth proximal interphalangeal joint. There is subchondral cystic or erosive change at the second and third MCP joints. No periostitis or chondrocalcinosis. 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 Possible flexion deformity of the fifth proximal interphalangeal joint. Subchondral cystic change versus early erosive change at the second and third MCP joints bilaterally. Electronically Signed   By: Maurine Simmering   On: 01/06/2021 13:26   DG Hand Complete Right  Result Date: 01/06/2021 CLINICAL DATA:  chronic generalized bilateral hand pain EXAM: RIGHT HAND - COMPLETE 3+ VIEW; LEFT HAND - COMPLETE 3+ VIEW COMPARISON:  None. FINDINGS: Right hand: No evidence of acute fracture. There is severe first carpometacarpal joint degenerative change. Scattered mild interphalangeal joint degenerative change. Subchondral cystic or erosive change at the second and third MCP joints. No periostitis or chondrocalcinosis. Left hand: No evidence of acute fracture. Severe first carpometacarpal joint degenerative change. There is flexion at the and fifth proximal interphalangeal joint. There is subchondral cystic or erosive change at the second and third MCP joints. No periostitis or chondrocalcinosis. 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 Possible flexion deformity of the fifth proximal interphalangeal joint. Subchondral cystic change versus early erosive change at the  second and third MCP joints bilaterally. Electronically Signed   By: Maurine Simmering   On: 01/06/2021 13:26   [place] LIMITED JOINT SPACE STRUCTURES LOW BILAT(NO LINKED  CHARGES)  Result Date: 01/04/2021 Formatting of this result is different from the original. Diagnostic Limited MSK Ultrasound of: Left shoulder Biceps tendon intact normal-appearing Subscapularis tendon is intact normal. Supraspinatus tendon is intact. Mild subacromial bursitis present Infraspinatus tendon is intact. AC joint mild effusion. Impression: Mild subacromial bursitis    [place] LIMITED JOINT SPACE STRUCTURES UP LEFT(NO LINKED CHARGES)  Result Date: 01/04/2021 Formatting of this result is different from the original. Diagnostic Limited MSK Ultrasound of: Left shoulder Biceps tendon intact normal-appearing Subscapularis tendon is intact normal. Supraspinatus tendon is intact. Mild subacromial bursitis present Infraspinatus tendon is intact. AC joint mild effusion. Impression: Mild subacromial bursitis    DG Knee AP/LAT W/Sunrise Left  Result Date: 01/06/2021 CLINICAL DATA:  bilateral knee pain EXAM: LEFT KNEE 3 VIEWS COMPARISON:  None. FINDINGS: There is no evidence of fracture. There is minimal tricompartment degenerative change. No significant joint effusion. IMPRESSION: No evidence of fracture. Minimal tricompartment degenerative change. Electronically Signed   By: Maurine Simmering   On: 01/06/2021 13:18   DG Knee AP/LAT W/Sunrise Right  Result Date: 01/06/2021 CLINICAL DATA:  bilateral knee pain EXAM: RIGHT KNEE 3 VIEWS COMPARISON:  None. FINDINGS: There is no evidence of acute fracture. There are well corticated osseous fragments adjacent to the medial patella. There is an mild medial and lateral and moderate patellofemoral compartment degenerative change. No significant joint effusion. IMPRESSION: Moderate patellofemoral and mild medial/lateral compartment degenerative change. 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 Electronically Signed   By: Maurine Simmering   On: 01/06/2021 13:20   I, Lynne Leader, personally  (independently) visualized and performed the interpretation of the xray images attached in this note.     Assessment and Plan: 60 y.o. male with left shoulder pain improving with early physical therapy.  Patient will likely have continued improvement with more PT as scheduled.  Plan to continue PT and home exercise program and will proceed to watchful waiting.  Bilateral hand pain.  Due to first [facility] DJD.  Discussed options.  Doing reasonably well with conservative management strategies however if not doing well enough next step would be injection.  Patient would like to avoid injections for now.  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  Again doing reasonably well with conservative management.  However injections could be next step.  Happy to do this as needed in the future.  Check back as needed.  Discussed treatment plan and options going forward.    Discussed warning signs or symptoms. Please see discharge instructions. Patient expresses understanding.   The above documentation has been reviewed and is accurate and complete Lynne Leader, M.D.

## 2021-02-14 NOTE — Patient Instructions (Signed)
Thank you for coming in today.   Plan to continue home exercises and PT.   I can do an injection into the thumbs or the knee at any time.   Let me know if you need anything.   Do what you can on your own.   Please use Voltaren gel (Generic Diclofenac Gel) up to 4x daily for pain as needed.  This is available over-the-counter as both the name brand Voltaren gel and the generic diclofenac gel.   Tumeric is ok too.

## 2021-02-14 NOTE — Patient Instructions (Signed)
Access Code: [facility] URL: [email or web address] Date: 02/14/2021 Prepared by: Faustino Congress  Exercises Supine Shoulder Flexion Extension AAROM with Dowel - 2-3 x daily - 7 x weekly - 2 sets - 10 reps Supine Bridge - 2-3 x daily - 7 x weekly - 2 sets - 10 reps Supine Active Straight Leg Raise - 2-3 x daily - 7 x weekly - 2 sets - 10 reps Corner Pec Major Stretch - 2-3 x daily - 7 x weekly - 3 reps - 20-30 seconds hold Standing Shoulder Internal Rotation Stretch with Towel - 2-3 x daily - 7 x weekly - 5-10 reps - 10 seconds hold Hooklying Single Leg Bent Knee Fallouts with Resistance - 2-3 x daily - 7 x weekly - 2 sets - 10 reps Seated Straight Leg Raise - 2-3 x daily - 7 x weekly - 2 sets - 10 reps

## 2021-02-21 ENCOUNTER — Encounter: Payer: Medicare Other | Admitting: Rehabilitative and Restorative Service Providers"

## 2021-02-27 ENCOUNTER — Encounter: Payer: Medicare Other | Admitting: Physical Therapy

## 2021-02-28 ENCOUNTER — Encounter: Payer: Medicare Other | Admitting: Rehabilitative and Restorative Service Providers"

## 2021-03-07 ENCOUNTER — Encounter: Payer: Medicare Other | Admitting: Rehabilitative and Restorative Service Providers"

## 2021-03-14 ENCOUNTER — Encounter: Payer: Medicare Other | Admitting: Rehabilitative and Restorative Service Providers"

## 2021-04-21 ENCOUNTER — Ambulatory Visit (INDEPENDENT_AMBULATORY_CARE_PROVIDER_SITE_OTHER): Payer: Medicare Other | Admitting: *Deleted

## 2021-04-21 DIAGNOSIS — Z0000 Encounter for general adult medical examination without abnormal findings: Secondary | ICD-10-CM

## 2021-04-21 NOTE — Progress Notes (Signed)
Subjective:   Ryan Wilson is a 60 y.o. male who presents for an Initial Medicare Annual Wellness Visit.  I connected with  Ryan Wilson on 04/21/21 by audio enabled telemedicine application and verified that I am speaking with the correct person using two identifiers.   I discussed the limitations of evaluation and management by telemedicine. The patient expressed understanding and agreed to proceed.   Location of Patient: Home Location of Provider: Home Office Persons participating in visit: Ryan Wilson (patient) & Jari Favre, CMA   Review of Systems    Defer to PCP       Objective:    There were no vitals filed for this visit. There is no height or weight on file to calculate BMI.  Advanced Directives 04/21/2021 02/05/2021 02/17/2019 06/17/2018 05/29/2018 02/12/2018 07/18/2017  Does Patient Have a Medical Advance Directive? No No No No No No No  Would patient like information on creating a medical advance directive? No - Patient declined No - Patient declined No - Patient declined No - Patient declined - No - Patient declined No - Patient declined    Current Medications (verified) Outpatient Encounter Medications as of 04/21/2021  Medication Sig   naproxen sodium (ANAPROX) 550 MG tablet Take 1 tablet (550 mg total) by mouth 2 (two) times daily with a meal.   omeprazole (PRILOSEC) 40 MG capsule Take 1 capsule (40 mg total) by mouth daily.   sildenafil (VIAGRA) 25 MG tablet Take 1 tablet (25 mg total) by mouth daily as needed for erectile dysfunction.   No facility-administered encounter medications on file as of 04/21/2021.    Allergies (verified) No known allergies   History: Past Medical History:  Diagnosis Date   Back pain    Heart murmur    PER PATIENT WAS HEARD BY A NURSE; DENIES SYMPTOMS     Inguinal hernia    REPAIRED  IN 1990s   Prostate cancer [facility])    Past Surgical History:  Procedure Laterality Date   [place], 1996   INGUINAL HERNIA    PELVIC LYMPH  NODE DISSECTION Bilateral 07/18/2017   Procedure: LIMITED PELVIC LYMPH NODE DISSECTION;  Surgeon: Alexis Frock, MD;  Location: [facility];  Service: Urology;  Laterality: Bilateral;   PROSTATE BIOPSY     ROBOT ASSISTED LAPAROSCOPIC RADICAL PROSTATECTOMY N/A 07/18/2017   Procedure: XI ROBOTIC ASSISTED LAPAROSCOPIC RADICAL PROSTATECTOMY, RIGHT INGUINAL HERNIA REPAIR;  Surgeon: Alexis Frock, MD;  Location: [facility];  Service: Urology;  Laterality: N/A;   Family History  Problem Relation Age of Onset   Prostate cancer Brother    Social History   Socioeconomic History   Marital status: Married    Spouse name: Not on file   Number of children: Not on file   Years of education: Not on file   Highest education level: Not on file  Occupational History   Not on file  Tobacco Use   Smoking status: Former    Packs/day: 0.50    Years: 4.00    Pack years: 2.00    Types: Cigarettes    Quit date: 07/16/2002    Years since quitting: 18.7   Smokeless tobacco: Never  Vaping Use   Vaping Use: Never used  Substance and Sexual Activity   Alcohol use: Yes    Alcohol/week: 6.0 standard drinks    Types: 2 Shots of liquor, 4 Cans of beer per week    Comment: 12oz x2 of beer per day   Drug use: No  Sexual activity: Not Currently  Other Topics Concern   Not on file  Social History Narrative   05-29-18 Unable to ask abuse questions today wife with him.   Social Determinants of Health   Financial Resource Strain: Low Risk    Difficulty of Paying Living Expenses: Not hard at all  Food Insecurity: No Food Insecurity   Worried About Charity fundraiser in the Last Year: Never true   [place] in the Last Year: Never true  Transportation Needs: No Transportation Needs   Lack of Transportation (Medical): No   Lack of Transportation (Non-Medical): No  Physical Activity: Sufficiently Active   Days of Exercise per Week: 7 days   Minutes of Exercise per Session: 60 min  Stress: No Stress  Concern Present   Feeling of Stress : Not at all  Social Connections: Moderately Isolated   Frequency of Communication with Friends and Family: More than three times a week   Frequency of Social Gatherings with Friends and Family: Three times a week   Attends Religious Services: 1 to 4 times per year   Active Member of Clubs or Organizations: No   Attends Archivist Meetings: Never   Marital Status: Separated    Tobacco Counseling Counseling given: Not Answered   Clinical Intake:     Pain : No/denies pain     BMI - recorded: 25.36 Nutritional Status: BMI 25 -29 Overweight Diabetes: No  How often do you need to have someone help you when you read instructions, pamphlets, or other written materials from your doctor or pharmacy?: 1 - Never  Diabetic? No  Interpreter Needed?: No      Activities of Daily Living In your present state of health, do you have any difficulty performing the following activities: 04/21/2021  Hearing? N  Vision? N  Difficulty concentrating or making decisions? N  Walking or climbing stairs? Y  Comment knee prob  Dressing or bathing? N  Doing errands, shopping? N  Preparing Food and eating ? N  Using the Toilet? N  In the past six months, have you accidently leaked urine? N  Do you have problems with loss of bowel control? N  Managing your Medications? N  Managing your Finances? N  Housekeeping or managing your Housekeeping? N  Some recent data might be hidden    Patient Care Team: Nche, Charlene Brooke, NP as PCP - General (Internal Medicine)  Indicate any recent Medical Services you may have received from other than [facility] providers in the past year (date may be approximate).     Assessment:   This is a routine wellness examination for Ryan Wilson.  Hearing/Vision screen No results found.  Dietary issues and exercise activities discussed: Current Exercise Habits: Home exercise routine, Type of exercise: walking, Time (Minutes):  20, Frequency (Times/Week): 3, Weekly Exercise (Minutes/Week): 60, Intensity: Mild, Exercise limited by: None identified   Goals Addressed   None    Depression Screen PHQ 2/9 Scores 04/21/2021 06/17/2018 02/12/2018  PHQ - 2 Score 0 2 0  PHQ- 9 Score - 8 -    Fall Risk Fall Risk  04/21/2021 11/30/2020 02/17/2019 06/17/2018 02/12/2018  Falls in the past year? 0 0 0 0 No  Number falls in past yr: 0 0 0 - -  Injury with Fall? 0 0 - - -  Risk for fall due to : - No Fall Risks - - -  Follow up - Falls evaluation completed - - -  FALL RISK PREVENTION PERTAINING TO THE HOME:  Any stairs in or around the home? Yes  If so, are there any without handrails? Yes  Home free of loose throw rugs in walkways, pet beds, electrical cords, etc? Yes  Adequate lighting in your home to reduce risk of falls? Yes   ASSISTIVE DEVICES UTILIZED TO PREVENT FALLS:  Life alert? No  Use of a cane, walker or w/c? No  Grab bars in the bathroom? Yes  Shower chair or bench in shower? Yes Elevated toilet seat or a handicapped toilet? No   TIMED UP AND GO:  Was the test performed?  n/a .  Length of time to ambulate 10 feet: n/a sec.     Cognitive Function:     6CIT Screen 04/21/2021  What Year? 0 points  What month? 0 points  What time? 0 points  Count back from 20 2 points  Months in reverse 4 points  Repeat phrase 10 points  Total Score 16    Immunizations Immunization History  Administered Date(s) Administered   PFIZER(Purple Top)SARS-COV-2 Vaccination 10/14/2019, 11/08/2019   Tdap 07/11/2020    TDAP status: Up to date  Flu Vaccine status: Declined, Education has been provided regarding the importance of this vaccine but patient still declined. Advised may receive this vaccine at local pharmacy or Health Dept. Aware to provide a copy of the vaccination record if obtained from local pharmacy or Health Dept. Verbalized acceptance and understanding.  Pneumococcal vaccine status: Due, Education  has been provided regarding the importance of this vaccine. Advised may receive this vaccine at local pharmacy or Health Dept. Aware to provide a copy of the vaccination record if obtained from local pharmacy or Health Dept. Verbalized acceptance and understanding.  Covid-19 vaccine status: Information provided on how to obtain vaccines.   Qualifies for Shingles Vaccine? Yes   Zostavax completed No   Shingrix Completed?: No.    Education has been provided regarding the importance of this vaccine. Patient has been advised to call insurance company to determine out of pocket expense if they have not yet received this vaccine. Advised may also receive vaccine at local pharmacy or Health Dept. Verbalized acceptance and understanding.  Screening Tests Health Maintenance  Topic Date Due   COVID-19 Vaccine (3 - Pfizer risk series) 05/07/2021 (Originally 12/06/2019)   Zoster Vaccines- Shingrix (1 of 2) 07/21/2021 (Originally 04/04/1982)   INFLUENZA VACCINE  10/26/2021 (Originally 02/26/2021)   COLONOSCOPY (Pts 45-28yrs Insurance coverage will need to be confirmed)  04/21/2022 (Originally 04/04/2008)   Hepatitis C Screening  04/21/2022 (Originally 04/04/1981)   HIV Screening  04/21/2022 (Originally 04/04/1978)   TETANUS/TDAP  07/11/2030   HPV VACCINES  Aged Out    Health Maintenance  There are no preventive care reminders to display for this patient.   Colonoscopy: Unable to recall date but states that he had polyps removed per records  Lung Cancer Screening: (Low Dose CT Chest recommended if Age 58-80 years, 30 pack-year currently smoking OR have quit w/in 15years.) does not qualify.    Additional Screening:  Hepatitis C Screening: does qualify; Not Completed   Vision Screening: Recommended annual ophthalmology exams for early detection of glaucoma and other disorders of the eye. Is the patient up to date with their annual eye exam?  Yes  Who is the provider or what is the name of the office in  which the patient attends annual eye exams? [facility] If pt is not established with a provider, would they like to  be referred to a provider to establish care? No .   Dental Screening: Recommended annual dental exams for proper oral hygiene  Community Resource Referral / Chronic Care Management: CRR required this visit?  No   CCM required this visit?  No      Plan:     I have personally reviewed and noted the following in the patient's chart:   Medical and social history Use of alcohol, tobacco or illicit drugs  Current medications and supplements including opioid prescriptions. Patient is not currently taking opioid prescriptions. Functional ability and status Nutritional status Physical activity Advanced directives List of other physicians Hospitalizations, surgeries, and ER visits in previous 12 months Vitals Screenings to include cognitive, depression, and falls Referrals and appointments  In addition, I have reviewed and discussed with patient certain preventive protocols, quality metrics, and best practice recommendations. A written personalized care plan for preventive services as well as general preventive health recommendations were provided to patient.     Ryan Wilson, [place]   04/21/2021   Nurse Notes: 15 minutes non face to face   Mr. Dyar , Thank you for taking time to come for your Medicare Wellness Visit. I appreciate your ongoing commitment to your health goals. Please review the following plan we discussed and let me know if I can assist you in the future.   These are the goals we discussed:  Goals   None     This is a list of the screening recommended for you and due dates:  Health Maintenance  Topic Date Due   COVID-19 Vaccine (3 - Pfizer risk series) 05/07/2021*   Zoster (Shingles) Vaccine (1 of 2) 07/21/2021*   Flu Shot  10/26/2021*   Colon Cancer Screening  04/21/2022*   Hepatitis C Screening: USPSTF Recommendation to screen - Ages 18-79  yo.  04/21/2022*   HIV Screening  04/21/2022*   Tetanus Vaccine  07/11/2030   HPV Vaccine  Aged Out  *Topic was postponed. The date shown is not the original due date.

## 2021-04-21 NOTE — Patient Instructions (Signed)
Health Maintenance, Male Adopting a healthy lifestyle and getting preventive care are important in promoting health and wellness. Ask your health care provider about: The right schedule for you to have regular tests and exams. Things you can do on your own to prevent diseases and keep yourself healthy. What should I know about diet, weight, and exercise? Eat a healthy diet  Eat a diet that includes plenty of vegetables, fruits, low-fat dairy products, and lean protein. Do not eat a lot of foods that are high in solid fats, added sugars, or sodium. Maintain a healthy weight Body mass index (BMI) is a measurement that can be used to identify possible weight problems. It estimates body fat based on height and weight. Your health care provider can help determine your BMI and help you achieve or maintain a healthy weight. Get regular exercise Get regular exercise. This is one of the most important things you can do for your health. Most adults should: Exercise for at least 150 minutes each week. The exercise should increase your heart rate and make you sweat (moderate-intensity exercise). Do strengthening exercises at least twice a week. This is in addition to the moderate-intensity exercise. Spend less time sitting. Even light physical activity can be beneficial. Watch cholesterol and blood lipids Have your blood tested for lipids and cholesterol at 58 years of age, then have this test every 5 years. You may need to have your cholesterol levels checked more often if: Your lipid or cholesterol levels are high. You are older than 58 years of age. You are at high risk for heart disease. What should I know about cancer screening? Many types of cancers can be detected early and may often be prevented. Depending on your health history and family history, you may need to have cancer screening at various ages. This may include screening for: Colorectal cancer. Prostate cancer. Skin cancer. Lung  cancer. What should I know about heart disease, diabetes, and high blood pressure? Blood pressure and heart disease High blood pressure causes heart disease and increases the risk of stroke. This is more likely to develop in people who have high blood pressure readings, are of African descent, or are overweight. Talk with your health care provider about your target blood pressure readings. Have your blood pressure checked: Every 3-5 years if you are 18-39 years of age. Every year if you are 40 years old or older. If you are between the ages of 65 and 75 and are a current or former smoker, ask your health care provider if you should have a one-time screening for abdominal aortic aneurysm (AAA). Diabetes Have regular diabetes screenings. This checks your fasting blood sugar level. Have the screening done: Once every three years after age 45 if you are at a normal weight and have a low risk for diabetes. More often and at a younger age if you are overweight or have a high risk for diabetes. What should I know about preventing infection? Hepatitis B If you have a higher risk for hepatitis B, you should be screened for this virus. Talk with your health care provider to find out if you are at risk for hepatitis B infection. Hepatitis C Blood testing is recommended for: Everyone born from 1945 through 1965. Anyone with known risk factors for hepatitis C. Sexually transmitted infections (STIs) You should be screened each year for STIs, including gonorrhea and chlamydia, if: You are sexually active and are younger than 58 years of age. You are older than 58 years   of age and your health care provider tells you that you are at risk for this type of infection. Your sexual activity has changed since you were last screened, and you are at increased risk for chlamydia or gonorrhea. Ask your health care provider if you are at risk. Ask your health care provider about whether you are at high risk for HIV.  Your health care provider may recommend a prescription medicine to help prevent HIV infection. If you choose to take medicine to prevent HIV, you should first get tested for HIV. You should then be tested every 3 months for as long as you are taking the medicine. Follow these instructions at home: Lifestyle Do not use any products that contain nicotine or tobacco, such as cigarettes, e-cigarettes, and chewing tobacco. If you need help quitting, ask your health care provider. Do not use street drugs. Do not share needles. Ask your health care provider for help if you need support or information about quitting drugs. Alcohol use Do not drink alcohol if your health care provider tells you not to drink. If you drink alcohol: Limit how much you have to 0-2 drinks a day. Be aware of how much alcohol is in your drink. In the [place] one drink equals one 12 oz bottle of beer (355 mL), one 5 oz glass of wine (148 mL), or one 1 oz glass of hard liquor (44 mL). General instructions Schedule regular health, dental, and eye exams. Stay current with your vaccines. Tell your health care provider if: You often feel depressed. You have ever been abused or do not feel safe at home. Summary Adopting a healthy lifestyle and getting preventive care are important in promoting health and wellness. Follow your health care provider's instructions about healthy diet, exercising, and getting tested or screened for diseases. Follow your health care provider's instructions on monitoring your cholesterol and blood pressure. This information is not intended to replace advice given to you by your health care provider. Make sure you discuss any questions you have with your health care provider. Document Revised: 09/22/2020 Document Reviewed: 07/08/2018 Elsevier Patient Education  2022 Elsevier Inc.  

## 2021-07-30 ENCOUNTER — Ambulatory Visit (HOSPITAL_COMMUNITY)
Admission: EM | Admit: 2021-07-30 | Discharge: 2021-07-30 | Disposition: A | Discharge status: Home / Self Care | Payer: Medicare Other | Attending: Family Medicine | Admitting: Family Medicine

## 2021-07-30 ENCOUNTER — Other Ambulatory Visit: Payer: Self-pay

## 2021-07-30 ENCOUNTER — Encounter (HOSPITAL_COMMUNITY): Payer: Self-pay

## 2021-07-30 DIAGNOSIS — J0110 Acute frontal sinusitis, unspecified: Secondary | ICD-10-CM | POA: Diagnosis not present

## 2021-07-30 DIAGNOSIS — R051 Acute cough: Secondary | ICD-10-CM

## 2021-07-30 MED ORDER — AMOXICILLIN 875 MG PO TABS: 875.0000 mg | ORAL_TABLET | Freq: Two times a day (BID) | ORAL | 0 refills | Status: AC

## 2021-07-30 MED ORDER — PROMETHAZINE-DM 6.25-15 MG/5ML PO SYRP: 5.0000 mL | ORAL_SOLUTION | Freq: Four times a day (QID) | ORAL | 0 refills | Status: DC | PRN

## 2021-07-30 NOTE — ED Triage Notes (Signed)
Pt presents with complaints of cough, nasal drainage, and headache x 1 week.

## 2021-07-30 NOTE — ED Provider Notes (Signed)
[facility]   [id number] 07/30/21 Arrival Time: 4098  ASSESSMENT & PLAN:  1. Acute non-recurrent frontal sinusitis   2. Acute cough    Begin: Meds ordered this encounter  Medications   amoxicillin (AMOXIL) 875 MG tablet    Sig: Take 1 tablet (875 mg total) by mouth 2 (two) times daily for 10 days.    Dispense:  20 tablet    Refill:  0   promethazine-dextromethorphan (PROMETHAZINE-DM) 6.25-15 MG/5ML syrup    Sig: Take 5 mLs by mouth 4 (four) times daily as needed for cough.    Dispense:  118 mL    Refill:  0   Discussed typical duration of symptoms. OTC symptom care as needed. Ensure adequate fluid intake and rest.   Follow-up Information     Nche, Charlene Brooke, NP.   Specialty: Internal Medicine Why: As needed. Contact information: [place] [address] [phone]                 Reviewed expectations re: course of current medical issues. Questions answered. Outlined signs and symptoms indicating need for more acute intervention. Patient verbalized understanding. After Visit Summary given.   SUBJECTIVE: History from: patient.  Ryan Wilson is a 61 y.o. male who presents with complaint of nasal congestion, post-nasal drainage, and sinus pain. Onset gradual,  over 1 wk ago . With assoc coughing; keeping up at night. Fever: absent. Overall normal PO intake without n/v. OTC treatment: various without much relief. Seasonal allergies: no. History of frequent sinus infections: denies. No specific aggravating or alleviating factors reported.  Social History   Tobacco Use  Smoking Status Former   Packs/day: 0.50   Years: 4.00   Pack years: 2.00   Types: Cigarettes   Quit date: 07/16/2002   Years since quitting: 19.0  Smokeless Tobacco Never   OBJECTIVE:  Vitals:   07/30/21 0956  BP: 114/83  Pulse: 72  Resp: 19  Temp: 98.2 F (36.8 C)  SpO2: 95%     General appearance: alert; no distress HEENT: nasal  congestion; clear runny nose; throat irritation secondary to post-nasal drainage; frontal tenderness to palpation; turbinates boggy Neck: supple without LAD; trachea midline Lungs: unlabored respirations, symmetrical air entry; cough: mild/dry; no respiratory distress Skin: warm and dry Psychological: alert and cooperative; normal mood and affect  Allergies  Allergen Reactions   No Known Allergies     Past Medical History:  Diagnosis Date   Back pain    Heart murmur    PER PATIENT WAS HEARD BY A NURSE; DENIES SYMPTOMS     Inguinal hernia    REPAIRED  IN 1990s   Prostate cancer (HCC)    Family History  Problem Relation Age of Onset   Prostate cancer Brother    Social History   Socioeconomic History   Marital status: Married    Spouse name: Not on file   Number of children: Not on file   Years of education: Not on file   Highest education level: Not on file  Occupational History   Not on file  Tobacco Use   Smoking status: Former    Packs/day: 0.50    Years: 4.00    Pack years: 2.00    Types: Cigarettes    Quit date: 07/16/2002    Years since quitting: 19.0   Smokeless tobacco: Never  Vaping Use   Vaping Use: Never used  Substance and Sexual Activity   Alcohol use: Yes    Alcohol/week: 6.0  standard drinks    Types: 2 Shots of liquor, 4 Cans of beer per week    Comment: 12oz x2 of beer per day   Drug use: No   Sexual activity: Not Currently  Other Topics Concern   Not on file  Social History Narrative   05-29-18 Unable to ask abuse questions today wife with him.   Social Determinants of Health   Financial Resource Strain: Low Risk    Difficulty of Paying Living Expenses: Not hard at all  Food Insecurity: No Food Insecurity   Worried About Charity fundraiser in the Last Year: Never true   [place] in the Last Year: Never true  Transportation Needs: No Transportation Needs   Lack of Transportation (Medical): No   Lack of Transportation  (Non-Medical): No  Physical Activity: Sufficiently Active   Days of Exercise per Week: 7 days   Minutes of Exercise per Session: 60 min  Stress: No Stress Concern Present   Feeling of Stress : Not at all  Social Connections: Moderately Isolated   Frequency of Communication with Friends and Family: More than three times a week   Frequency of Social Gatherings with Friends and Family: Three times a week   Attends Religious Services: 1 to 4 times per year   Active Member of Clubs or Organizations: No   Attends Archivist Meetings: Never   Marital Status: Separated  Intimate Partner Violence: Not At Risk   Fear of Current or Ex-Partner: No   Emotionally Abused: No   Physically Abused: No   Sexually Abused: No             Vanessa Kick, MD 07/30/21 1256

## 2021-09-04 DIAGNOSIS — C61 Malignant neoplasm of prostate: Secondary | ICD-10-CM | POA: Diagnosis not present

## 2021-09-11 DIAGNOSIS — N393 Stress incontinence (female) (male): Secondary | ICD-10-CM | POA: Diagnosis not present

## 2021-09-11 DIAGNOSIS — C61 Malignant neoplasm of prostate: Secondary | ICD-10-CM | POA: Diagnosis not present

## 2022-02-10 ENCOUNTER — Encounter (HOSPITAL_COMMUNITY): Payer: Self-pay | Admitting: Emergency Medicine

## 2022-02-10 ENCOUNTER — Other Ambulatory Visit: Payer: Self-pay

## 2022-02-10 ENCOUNTER — Emergency Department (HOSPITAL_COMMUNITY)
Admission: EM | Admit: 2022-02-10 | Discharge: 2022-02-10 | Disposition: A | Discharge status: Home / Self Care | Payer: Medicare Other | Attending: Emergency Medicine | Admitting: Emergency Medicine

## 2022-02-10 ENCOUNTER — Emergency Department (HOSPITAL_COMMUNITY): Payer: Medicare Other

## 2022-02-10 DIAGNOSIS — R0602 Shortness of breath: Secondary | ICD-10-CM | POA: Insufficient documentation

## 2022-02-10 DIAGNOSIS — R55 Syncope and collapse: Secondary | ICD-10-CM | POA: Diagnosis not present

## 2022-02-10 DIAGNOSIS — R002 Palpitations: Secondary | ICD-10-CM | POA: Diagnosis not present

## 2022-02-10 DIAGNOSIS — R0789 Other chest pain: Secondary | ICD-10-CM | POA: Insufficient documentation

## 2022-02-10 DIAGNOSIS — R42 Dizziness and giddiness: Secondary | ICD-10-CM | POA: Diagnosis not present

## 2022-02-10 DIAGNOSIS — R0609 Other forms of dyspnea: Secondary | ICD-10-CM | POA: Diagnosis not present

## 2022-02-10 DIAGNOSIS — B351 Tinea unguium: Secondary | ICD-10-CM | POA: Diagnosis not present

## 2022-02-10 DIAGNOSIS — R000 Tachycardia, unspecified: Secondary | ICD-10-CM | POA: Diagnosis not present

## 2022-02-10 DIAGNOSIS — M5431 Sciatica, right side: Secondary | ICD-10-CM | POA: Diagnosis not present

## 2022-02-10 DIAGNOSIS — I25110 Atherosclerotic heart disease of native coronary artery with unstable angina pectoris: Secondary | ICD-10-CM | POA: Diagnosis not present

## 2022-02-10 LAB — CBC WITH DIFFERENTIAL/PLATELET
Abs Immature Granulocytes: 0.01 10*3/uL (ref 0.00–0.07)
Basophils Absolute: 0 10*3/uL (ref 0.0–0.1)
Basophils Relative: 1 %
Eosinophils Absolute: 0.3 10*3/uL (ref 0.0–0.5)
Eosinophils Relative: 5 %
HCT: 46.3 % (ref 39.0–52.0)
Hemoglobin: 16.4 g/dL (ref 13.0–17.0)
Immature Granulocytes: 0 %
Lymphocytes Relative: 26 %
Lymphs Abs: 1.3 10*3/uL (ref 0.7–4.0)
MCH: 32 pg (ref 26.0–34.0)
MCHC: 35.4 g/dL (ref 30.0–36.0)
MCV: 90.3 fL (ref 80.0–100.0)
Monocytes Absolute: 0.5 10*3/uL (ref 0.1–1.0)
Monocytes Relative: 9 %
Neutro Abs: 3 10*3/uL (ref 1.7–7.7)
Neutrophils Relative %: 59 %
Platelets: 206 10*3/uL (ref 150–400)
RBC: 5.13 MIL/uL (ref 4.22–5.81)
RDW: 12 % (ref 11.5–15.5)
WBC: 5 10*3/uL (ref 4.0–10.5)
nRBC: 0 % (ref 0.0–0.2)

## 2022-02-10 LAB — COMPREHENSIVE METABOLIC PANEL
ALT: 27 U/L (ref 0–44)
AST: 25 U/L (ref 15–41)
Albumin: 4 g/dL (ref 3.5–5.0)
Alkaline Phosphatase: 50 U/L (ref 38–126)
Anion gap: 11 (ref 5–15)
BUN: 17 mg/dL (ref 6–20)
CO2: 22 mmol/L (ref 22–32)
Calcium: 9.2 mg/dL (ref 8.9–10.3)
Chloride: 106 mmol/L (ref 98–111)
Creatinine, Ser: 1.02 mg/dL (ref 0.61–1.24)
GFR, Estimated: >60 mL/min (ref >60)
Glucose, Bld: 117 mg/dL — ABNORMAL HIGH (ref 70–99)
Potassium: 3.8 mmol/L (ref 3.5–5.1)
Sodium: 139 mmol/L (ref 135–145)
Total Bilirubin: 0.8 mg/dL (ref 0.3–1.2)
Total Protein: 6.4 g/dL — ABNORMAL LOW (ref 6.5–8.1)

## 2022-02-10 LAB — TROPONIN I (HIGH SENSITIVITY): Troponin I (High Sensitivity): <2 ng/L (ref <18)

## 2022-02-10 LAB — TSH: TSH: 1.091 u[IU]/mL (ref 0.350–4.500)

## 2022-02-10 LAB — D-DIMER, QUANTITATIVE: D-Dimer, Quant: <0.27 ug/mL-FEU (ref 0.00–0.50)

## 2022-02-10 NOTE — ED Triage Notes (Signed)
C/o intermittent fast HR and SOB x 2-3 months.  Occasionally has L sided chest pain when stretching L arm.  Denies pain at present.

## 2022-02-10 NOTE — ED Notes (Signed)
Pt provided discharge instructions and prescription information. Pt was given the opportunity to ask questions and questions were answered.   

## 2022-02-10 NOTE — ED Provider Notes (Signed)
[facility] EMERGENCY DEPARTMENT Provider Note   CSN: [id number] Arrival date & time: 02/10/22  1540     History  Chief Complaint  Patient presents with   Tachycardia   Shortness of Breath    Ryan Wilson is a 61 y.o. male.  Patient without cardiac history other than reported murmur presents to the ED for 3-4 days of palpitations and shortness of breath. He says the palpitations occurs w/o a specific trigger, followed shortly by SOB, dizziness, and black spots in his vision. He endorses some chest pain and tightness when the episodes occur. He says rest improves the episodes. He cannot identify a specific trigger. He denies fever, N/V, abdominal pain, syncope/fainting, dyspnea, back pain. He currently drinks at least 1 shot of alcohol per day, endorses at least 3 alcoholic beverages on the weekend. He drinks coffee and soda daily. Denies smoking, or illicit drug use. He says he was bitten by a tick in the back of his right leg last year and did not have it evaluated.  Patient was seen at urgent care this morning, prescribed Medrol Dosepak, muscle relaxer and NSAIDs for right hip and thigh numbness which he has been having as well.  They sent him over here for cardiac evaluation given his other symptoms.  He was reportedly tachycardic on arrival to 150 bpm.       Home Medications Prior to Admission medications   Medication Sig Start Date End Date Taking? Authorizing Provider  naproxen sodium (ANAPROX) 550 MG tablet Take 1 tablet (550 mg total) by mouth 2 (two) times daily with a meal. 11/30/20   Nche, Charlene Brooke, NP  omeprazole (PRILOSEC) 40 MG capsule Take 1 capsule (40 mg total) by mouth daily. 06/17/18   Asencion Noble, MD  promethazine-dextromethorphan (PROMETHAZINE-DM) 6.25-15 MG/5ML syrup Take 5 mLs by mouth 4 (four) times daily as needed for cough. 07/30/21   Vanessa Kick, MD  sildenafil (VIAGRA) 25 MG tablet Take 1 tablet (25 mg total) by mouth daily as  needed for erectile dysfunction. 04/17/18   Tyler Pita, MD      Allergies    No known allergies    Review of Systems   Review of Systems  Physical Exam Updated Vital Signs BP 116/90   Pulse 74   Temp 98.6 F (37 C) (Oral)   Resp 16   SpO2 97%   Physical Exam Vitals and nursing note reviewed.  Constitutional:      Appearance: He is well-developed. He is not diaphoretic.  HENT:     Head: Normocephalic and atraumatic.     Mouth/Throat:     Mouth: Mucous membranes are not dry.  Eyes:     Conjunctiva/sclera: Conjunctivae normal.  Neck:     Vascular: Normal carotid pulses. No carotid bruit or JVD.     Trachea: Trachea normal. No tracheal deviation.  Cardiovascular:     Rate and Rhythm: Normal rate and regular rhythm.     Pulses: No decreased pulses.          Radial pulses are 2+ on the right side and 2+ on the left side.     Heart sounds: Normal heart sounds, S1 normal and S2 normal. Heart sounds not distant. No murmur heard. Pulmonary:     Effort: Pulmonary effort is normal. No respiratory distress.     Breath sounds: Normal breath sounds. No wheezing.  Chest:     Chest wall: No tenderness.  Abdominal:     General: Bowel sounds  are normal.     Palpations: Abdomen is soft.     Tenderness: There is no abdominal tenderness. There is no guarding or rebound.  Musculoskeletal:     Cervical back: Normal range of motion and neck supple. No muscular tenderness.     Right lower leg: No edema.     Left lower leg: No edema.  Skin:    General: Skin is warm and dry.     Coloration: Skin is not pale.  Neurological:     Mental Status: He is alert. Mental status is at baseline.  Psychiatric:        Mood and Affect: Mood normal.     ED Results / Procedures / Treatments   Labs (all labs ordered are listed, but only abnormal results are displayed) Labs Reviewed  COMPREHENSIVE METABOLIC PANEL - Abnormal; Notable for the following components:      Result Value   Glucose,  Bld 117 (*)    Total Protein 6.4 (*)    All other components within normal limits  CBC WITH DIFFERENTIAL/PLATELET  D-DIMER, QUANTITATIVE  TSH  LYME DISEASE SEROLOGY W/REFLEX  TROPONIN I (HIGH SENSITIVITY)    ED ECG REPORT   Date: 02/10/2022  Rate: 83  Rhythm: normal sinus rhythm  QRS Axis: normal  Intervals: normal  ST/T Wave abnormalities: normal  Conduction Disutrbances:none  Narrative Interpretation:   Old EKG Reviewed: unchanged  I have personally reviewed the EKG tracing and agree with the computerized printout as noted.   Radiology DG Chest 2 View  Result Date: 02/10/2022 CLINICAL DATA:  61 year old male with history of shortness of breath and dizziness. EXAM: CHEST - 2 VIEW COMPARISON:  Chest x-ray 02/10/2022. FINDINGS: Lung volumes are normal. No consolidative airspace disease. No pleural effusions. No pneumothorax. No pulmonary nodule or mass noted. Pulmonary vasculature and the cardiomediastinal silhouette are within normal limits. IMPRESSION: No radiographic evidence of acute cardiopulmonary disease. Electronically Signed   By: Vinnie Langton M.D.   On: 02/10/2022 10:42    Procedures Procedures    Medications Ordered in ED Medications - No data to display  ED Course/ Medical Decision Making/ A&P    Patient seen and examined. History obtained directly from patient. Also reviewed note sent with patient from urgent care at bedside.   Labs/EKG: Reviewed and interpreted labs ordered in triage including CBC and CMP.  CBC unremarkable; CMP with normal electrolytes, glucose 117.  Added troponin, D-dimer, TSH and Lyme titer.  Imaging: Personally reviewed and interpreted chest x-ray, agree negative.  Medications/Fluids: None ordered  Most recent vital signs reviewed and are as follows: BP 116/90   Pulse 74   Temp 98.6 F (37 C) (Oral)   Resp 16   SpO2 97%   Initial impression: Palpitations  6:48 PM Reassessment performed. Patient appears comfortable.  No  recurrent symptoms.  Personally reviewed telemetry.  No events during monitoring in the emergency department.  Troponin was normal; D-dimer was negative; TSH was normal.  Lyme titers pending.  Reviewed pertinent lab work and imaging with patient at bedside. Questions answered.   Most current vital signs reviewed and are as follows: BP 112/88 (BP Location: Right Arm)   Pulse 76   Temp 98.6 F (37 C) (Oral)   Resp 20   SpO2 96%   Plan: Discharge to home.   Prescriptions written for: None  Other home care instructions discussed: Avoidance of triggers  Return and follow-up instructions: I placed ambulatory referral to cardiology on patient's behalf due to palpitations  and shortness of breath episodes.  I encouraged patient to return to ED with severe chest pain, especially if the pain is crushing or pressure-like and spreads to the arms, back, neck, or jaw, or if they have associated sweating, vomiting, or shortness of breath with the pain, or significant pain with activity. We discussed that the evaluation here today indicates a low-risk of serious cause of chest pain, including heart trouble or a blood clot, but no evaluation is perfect and chest pain can evolve with time. The patient verbalized understanding and agreed.  I encouraged patient to follow-up with their provider in the next 48 hours for recheck.                             Medical Decision Making Amount and/or Complexity of Data Reviewed Labs: ordered.   For this patient's complaint of SOB and palpitations, the following emergent conditions were considered on the differential diagnosis: acute coronary syndrome, pulmonary embolism, pneumothorax, myocarditis, pericardial tamponade, aortic dissection, thoracic aortic aneurysm complication, esophageal perforation, arrhythmia such as SVT, atrial fibrillation with RVR .   Other causes were also considered including: gastroesophageal reflux disease, musculoskeletal pain including  costochondritis, pneumonia/pleurisy, herpes zoster, pericarditis.  In regards to possibility of ACS, patient has atypical features of pain, non-ischemic and unchanged EKG and negative troponin(s). Heart score was calculated to be 2.   In regards to possibility of PE, d-dimer neg and patient is low-risk [place].   The patient's vital signs, pertinent lab work and imaging were reviewed and interpreted as discussed in the ED course. Hospitalization was considered for further testing, treatments, or serial exams/observation. However as patient is well-appearing, has a stable exam, and reassuring studies today, I do not feel that they warrant admission at this time. This plan was discussed with the patient who verbalizes agreement and comfort with this plan and seems reliable and able to return to the Emergency Department with worsening or changing symptoms.           Final Clinical Impression(s) / ED Diagnoses Final diagnoses:  Palpitations    Rx / DC Orders ED Discharge Orders          Ordered    Ambulatory referral to Cardiology       Comments: If you have not heard from the Cardiology office within the next 72 hours please call [phone].   02/10/22 1845              Carlisle Cater, PA-C 02/10/22 1851    Valarie Merino, MD 02/10/22 [phone]

## 2022-02-10 NOTE — Discharge Instructions (Signed)
Please read and follow all provided instructions.  Your diagnoses today include:  1. Palpitations     Tests performed today include: An EKG of your heart A chest x-ray Cardiac enzymes - a blood test for heart muscle damage Blood counts and electrolytes Screening test for blood clots - was negative Thyroid testing - was normal Vital signs. See below for your results today.   Medications prescribed:  None  Take any prescribed medications only as directed.  Follow-up instructions: Please follow-up with your primary care provider and cardiology referral when able.   Return instructions:  SEEK IMMEDIATE MEDICAL ATTENTION IF: You have severe chest pain, especially if the pain is crushing or pressure-like and spreads to the arms, back, neck, or jaw, or if you have sweating, nausea or vomiting, or trouble with breathing. THIS IS AN EMERGENCY. Do not wait to see if the pain will go away. Get medical help at once. Call 911. DO NOT drive yourself to the hospital.  Your chest pain gets worse and does not go away after a few minutes of rest.  You have an attack of chest pain lasting longer than what you usually experience.  You have significant dizziness, if you pass out, or have trouble walking.  You have chest pain not typical of your usual pain for which you originally saw your caregiver.  You have any other emergent concerns regarding your health.  Additional Information: Chest pain comes from many different causes. Your caregiver has diagnosed you as having chest pain that is not specific for one problem, but does not require admission.  You are at low risk for an acute heart condition or other serious illness.   Your vital signs today were: BP 112/88 (BP Location: Right Arm)   Pulse 76   Temp 98.6 F (37 C) (Oral)   Resp 20   SpO2 96%  If your blood pressure (BP) was elevated above 135/85 this visit, please have this repeated by your doctor within one month. --------------

## 2022-02-10 NOTE — ED Provider Triage Note (Signed)
Emergency Medicine Provider Triage Evaluation Note  Ryan Wilson , a 61 y.o. male  was evaluated in triage.  Pt complains of palpitations and shortness of breath.  Patient states that he has had the symptoms off and on for the past 3 to 4 days.  He states that they are worse with physical activity.  This morning symptoms occur not long after awakening with minimal physical activity.  He had difficulty catching his breath which prompted his visit to the emergency department.  Last week, he had similar symptoms with feelings of dizziness and feelings of as if he was going to pass out but did not.  He has a history of prostate cancer but denies cardiac or pulmonary history.  Associated symptoms include nausea.  Denies chest pain, abdominal pain, vomiting, fever, chills, night sweats.  Review of Systems  Positive: See above Negative:   Physical Exam  BP (!) 134/91 (BP Location: Right Arm)   Pulse 79   Temp 98.6 F (37 C) (Oral)   Resp 16   SpO2 97%  Gen:   Awake, no distress   Resp:  Normal effort  MSK:   Moves extremities without difficulty  Other:    Medical Decision Making  Medically screening exam initiated at 9:55 AM.  Appropriate orders placed.  Ryan Wilson was informed that the remainder of the evaluation will be completed by another provider, this initial triage assessment does not replace that evaluation, and the importance of remaining in the ED until their evaluation is complete.     Wilnette Kales, [place] 02/10/22 [phone]

## 2022-02-11 NOTE — Progress Notes (Addendum)
Cardiology Office Note:    Date:  02/12/2022   ID:  Ryan Wilson, DOB [birth year], MRN [id number]  PCP:  Anne Ng, NP   [facility] Providers Cardiologist:  Alverda Skeans, MD Referring MD: Renne Crigler, PA-C   Chief Complaint/Reason for Referral: Palpitations  ASSESSMENT:    1. Palpitations     PLAN:    In order of problems listed above: 1.  Palpitations: His laboratories including a TSH were within normal limits.  We will place a monitor and echocardiogram to evaluate further.  I did tell him to perhaps trial eliminating his alcohol use to see if this improves his symptoms.  We will keep follow-up open-ended depending on the results of this collection of testing.   Dispo:  Return if symptoms worsen or fail to improve.      Medication Adjustments/Labs and Tests Ordered: Current medicines are reviewed at length with the patient today.  Concerns regarding medicines are outlined above.  The following changes have been made:  no change   Labs/tests ordered: Orders Placed This Encounter  Procedures   LONG TERM MONITOR (3-14 DAYS)   ECHOCARDIOGRAM COMPLETE    Medication Changes: No orders of the defined types were placed in this encounter.    Current medicines are reviewed at length with the patient today.  The patient does not have concerns regarding medicines.   History of Present Illness:    FOCUSED PROBLEM LIST:   1.  Daily alcohol use  The patient is a 61 y.o. male with the indicated medical history here for for emergency room follow-up for palpitations.  The patient was seen in the emergency department recently for palpitations.  He started a few days ago.  In the emergency department he was hemodynamically stable with a reassuring evaluation including normal cardiac biomarkers, reassuring EKG, chest x-ray, and other laboratories.  He was discharged home.  The patient tells me maybe once or twice a week he will get palpitations.  They are  associated with a little bit of lightheadedness and shortness of breath but no chest pain.  He denies any exertional angina.  He has had no peripheral edema, paroxysmal nocturnal dyspnea, or orthopnea.  He does not smoke but he does drink especially on the weekends.  He does not think that he experiences palpitations on the weekends however.  He denies any severe bleeding or bruising episodes, signs or symptoms of stroke, or need for hospitalization.   Current Medications: Current Meds  Medication Sig   sildenafil (VIAGRA) 25 MG tablet Take 25 mg by mouth daily as needed for erectile dysfunction.     Allergies:    No known allergies   Social History:   Social History   Tobacco Use   Smoking status: Former    Packs/day: 0.50    Years: 4.00    Total pack years: 2.00    Types: Cigarettes    Quit date: 07/16/2002    Years since quitting: 19.5   Smokeless tobacco: Never  Vaping Use   Vaping Use: Never used  Substance Use Topics   Alcohol use: Yes    Alcohol/week: 6.0 standard drinks of alcohol    Types: 2 Shots of liquor, 4 Cans of beer per week    Comment: 12oz x2 of beer per day   Drug use: No     Family Hx: Family History  Problem Relation Age of Onset   Prostate cancer Brother      Review of Systems:   Please see  the history of present illness.    All other systems reviewed and are negative.     EKGs/Labs/Other Test Reviewed:    EKG:  EKG performed July 2023 that I personally reviewed demonstrates sinus rhythm with sinus arrhythmia  Prior CV studies: None available  Other studies Reviewed: Review of the additional studies/records demonstrates: None relevant  Recent Labs: 02/10/2022: ALT 27; BUN 17; Creatinine, Ser 1.02; Hemoglobin 16.4; Platelets 206; Potassium 3.8; Sodium 139; TSH 1.091   Recent Lipid Panel No results found for: "CHOL", "TRIG", "HDL", "LDLCALC", "LDLDIRECT"  Risk Assessment/Calculations:           Physical Exam:    VS:  BP 118/82    Pulse 86   Ht 5\' 11"  (1.803 m)   Wt 177 lb 12.8 oz (80.6 kg)   SpO2 97%   BMI 24.80 kg/m    Wt Readings from Last 3 Encounters:  02/12/22 177 lb 12.8 oz (80.6 kg)  02/14/21 171 lb 12.8 oz (77.9 kg)  01/03/21 175 lb 12.8 oz (79.7 kg)    GENERAL:  No apparent distress, AOx3 HEENT:  No carotid bruits, +2 carotid impulses, no scleral icterus CAR: RRR no murmurs, gallops, rubs, or thrills RES:  Clear to auscultation bilaterally ABD:  Soft, nontender, nondistended, positive bowel sounds x 4 VASC:  +2 radial pulses, +2 carotid pulses, palpable pedal pulses NEURO:  CN 2-12 grossly intact; motor and sensory grossly intact PSYCH:  No active depression or anxiety EXT:  No edema, ecchymosis, or cyanosis  Signed, Orbie Pyo, MD  02/12/2022 10:26 AM    [facility] [address] [place], [place], [place]  [address] Phone: [phone]; Fax: [phone]   Note:  This document was prepared using Dragon voice recognition software and may include unintentional dictation errors.

## 2022-02-12 ENCOUNTER — Ambulatory Visit (INDEPENDENT_AMBULATORY_CARE_PROVIDER_SITE_OTHER): Payer: Medicare Other

## 2022-02-12 ENCOUNTER — Encounter: Payer: Self-pay | Admitting: Internal Medicine

## 2022-02-12 ENCOUNTER — Ambulatory Visit: Payer: Medicare Other | Admitting: Internal Medicine

## 2022-02-12 VITALS — BP 118/82 | HR 86 | Ht 71.0 in | Wt 177.8 lb

## 2022-02-12 DIAGNOSIS — R002 Palpitations: Secondary | ICD-10-CM

## 2022-02-12 LAB — LYME DISEASE SEROLOGY W/REFLEX: Lyme Total Antibody EIA: NEGATIVE

## 2022-02-12 NOTE — Patient Instructions (Signed)
Medication Instructions:  No changes *If you need a refill on your cardiac medications before your next appointment, please call your pharmacy*   Lab Work: none If you have labs (blood work) drawn today and your tests are completely normal, you will receive your results only by: [place] (if you have MyChart) OR A paper copy in the mail If you have any lab test that is abnormal or we need to change your treatment, we will call you to review the results.   Testing/Procedures: Your physician has requested that you have an echocardiogram. Echocardiography is a painless test that uses sound waves to create images of your heart. It provides your doctor with information about the size and shape of your heart and how well your heart's chambers and valves are working. This procedure takes approximately one hour. There are no restrictions for this procedure.  Zio Monitor 5 days  Follow-Up: As needed.

## 2022-02-12 NOTE — Progress Notes (Unsigned)
ZIO XT serial # [id number] from office inventory applied to patient.

## 2022-02-22 DIAGNOSIS — R002 Palpitations: Secondary | ICD-10-CM | POA: Diagnosis not present

## 2022-02-25 ENCOUNTER — Ambulatory Visit (HOSPITAL_COMMUNITY): Payer: Medicare Other | Attending: Cardiology

## 2022-02-25 DIAGNOSIS — R011 Cardiac murmur, unspecified: Secondary | ICD-10-CM | POA: Insufficient documentation

## 2022-02-25 DIAGNOSIS — R002 Palpitations: Secondary | ICD-10-CM | POA: Diagnosis not present

## 2022-02-25 LAB — ECHOCARDIOGRAM COMPLETE
Area-P 1/2: 3.77 cm2
S' Lateral: 3.6 cm

## 2022-02-27 DIAGNOSIS — R1013 Epigastric pain: Secondary | ICD-10-CM | POA: Diagnosis not present

## 2022-02-27 DIAGNOSIS — Z86010 Personal history of colonic polyps: Secondary | ICD-10-CM | POA: Diagnosis not present

## 2022-02-27 DIAGNOSIS — R1033 Periumbilical pain: Secondary | ICD-10-CM | POA: Diagnosis not present

## 2022-03-04 ENCOUNTER — Telehealth: Payer: Self-pay | Admitting: Internal Medicine

## 2022-03-04 DIAGNOSIS — R002 Palpitations: Secondary | ICD-10-CM | POA: Diagnosis not present

## 2022-03-04 NOTE — Telephone Encounter (Signed)
Noted. He wore a monitor for 5 days "Notes recorded by Ryan Wilson on 02/25/2022 at 2:30 PM EDT Please let him know that his monitor showed only occasional extra beats and no serious rhythm abnormalities. These extra beats are normal. Abstaining from alcohol will likely help his overall symptom burden."  He had echo which was normal.  I had reviewed these results w him and suggested ways to help suppress palpitations at that time.  No HRs/BPs available today.

## 2022-03-04 NOTE — Telephone Encounter (Signed)
Patient c/o Palpitations:  High priority if patient c/o lightheadedness, shortness of breath, or chest pain  How long have you had palpitations/irregular HR/ Afib? Are you having the symptoms now? palpitations  Are you currently experiencing lightheadedness, SOB or CP? no  Do you have a history of afib (atrial fibrillation) or irregular heart rhythm? palpitations  Have you checked your BP or HR? (document readings if available): no, does not check these  Are you experiencing any other symptoms? States he has been getting SOB and can feel the palpitations coming on. No symptoms right now. He states that something just does not feel right. He is scheduled to see Christen Bame, NP on 08/17

## 2022-03-04 NOTE — Telephone Encounter (Signed)
Early Osmond, MD  You 1 hour ago (3:45 PM)   AT Toprol 12.'5mg'$  qpm   __________________________________________________________________________________________  I called and left message for patient to call back.

## 2022-03-07 MED ORDER — METOPROLOL SUCCINATE ER 25 MG PO TB24: 12.5000 mg | ORAL_TABLET | Freq: Every day | ORAL | 3 refills | Status: DC

## 2022-03-07 NOTE — Telephone Encounter (Signed)
Called patient and advised of recommendation for Toprol XL 12.5 mg daily at bedtime.  He will start tonight and be able to provide update on symptoms at follow up appointment next week.

## 2022-03-12 DIAGNOSIS — K3189 Other diseases of stomach and duodenum: Secondary | ICD-10-CM | POA: Diagnosis not present

## 2022-03-12 DIAGNOSIS — K2990 Gastroduodenitis, unspecified, without bleeding: Secondary | ICD-10-CM | POA: Diagnosis not present

## 2022-03-12 DIAGNOSIS — K2950 Unspecified chronic gastritis without bleeding: Secondary | ICD-10-CM | POA: Diagnosis not present

## 2022-03-12 DIAGNOSIS — B9681 Helicobacter pylori [H. pylori] as the cause of diseases classified elsewhere: Secondary | ICD-10-CM | POA: Diagnosis not present

## 2022-03-12 DIAGNOSIS — K269 Duodenal ulcer, unspecified as acute or chronic, without hemorrhage or perforation: Secondary | ICD-10-CM | POA: Diagnosis not present

## 2022-03-12 DIAGNOSIS — R1033 Periumbilical pain: Secondary | ICD-10-CM | POA: Diagnosis not present

## 2022-03-12 DIAGNOSIS — K5730 Diverticulosis of large intestine without perforation or abscess without bleeding: Secondary | ICD-10-CM | POA: Diagnosis not present

## 2022-03-12 DIAGNOSIS — Z1211 Encounter for screening for malignant neoplasm of colon: Secondary | ICD-10-CM | POA: Diagnosis not present

## 2022-03-12 NOTE — Progress Notes (Unsigned)
Cardiology Office Note:    Date:  03/14/2022   ID:  Ryan Wilson, DOB September 10, 1960, MRN [id number]  PCP:  Flossie Buffy, NP   [place] Providers Cardiologist:  Early Osmond, MD     Referring MD: Flossie Buffy, NP   Chief Complaint: palpitations  History of Present Illness:    Ryan Wilson is a very pleasant 61 y.o. male with a hx of prostate cancer and palpitations.   He was seen in our office on 02/12/2022 by Dr. Ali Lowe for ED follow-up of palpitations.  ED work-up included normal cardiac biomarkers, reassuring EKG, CXR and other laboratories.  Discharged home and advised to follow-up as OP.  Reporting palpitations that occur once or twice a week associated with mild lightheadedness and SOB but no chest pain.  He denied exertional angina.  Drinks alcohol on the weekends.  Does not feel that palpitations worsen with alcohol intake. EKG revealed sinus rhythm with sinus arrhythmia. Cardiac monitor revealed underlying sinus rhythm, 6 runs of SVT with longest lasting 6 beats, rare ectopic beats, no atrial fibrillation, no ventricular tachyarrhythmias or bradyarrhythmias, no patient triggered events. Echo revealed normal LVEF 55%, G1DD, normal RV, no rwma, no significant valve abnormality. Advised to follow-up as needed.  Today, he is here alone for follow-up. He reports occasional palpitations described as a "catching" sensation in the middle of his chest.  We reviewed his monitor report in detail and all questions were answered to his satisfaction. We discussed screening for additional CV risk.  He is avoiding stimulants such as alcohol and caffeine, wants to do anything he can to reduce palpitations. 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 He denies chest pain, shortness of breath, lower extremity edema, fatigue, melena, hematuria, hemoptysis, diaphoresis, weakness, presyncope, syncope, orthopnea, and PND.  Past Medical History:  Diagnosis  Date   Back pain    Heart murmur    PER PATIENT WAS HEARD BY A NURSE; DENIES SYMPTOMS     Inguinal hernia    REPAIRED  IN 1990s   Prostate cancer [facility])     Past Surgical History:  Procedure Laterality Date   [place], 1996   INGUINAL HERNIA    PELVIC LYMPH NODE DISSECTION Bilateral 07/18/2017   Procedure: LIMITED PELVIC LYMPH NODE DISSECTION;  Surgeon: Alexis Frock, MD;  Location: [facility];  Service: Urology;  Laterality: Bilateral;   PROSTATE BIOPSY     ROBOT ASSISTED LAPAROSCOPIC RADICAL PROSTATECTOMY N/A 07/18/2017   Procedure: XI ROBOTIC ASSISTED LAPAROSCOPIC RADICAL PROSTATECTOMY, RIGHT INGUINAL HERNIA REPAIR;  Surgeon: Alexis Frock, MD;  Location: [facility];  Service: Urology;  Laterality: N/A;    Current Medications: Current Meds  Medication Sig   cyclobenzaprine (FLEXERIL) 10 MG tablet Take 10 mg by mouth at bedtime.   naproxen (NAPROSYN) 500 MG tablet Take 500 mg by mouth 2 (two) times daily.   sildenafil (VIAGRA) 25 MG tablet Take 25 mg by mouth daily as needed for erectile dysfunction.   [DISCONTINUED] metoprolol succinate (TOPROL XL) 25 MG 24 hr tablet Take 0.5 tablets (12.5 mg total) by mouth at bedtime.     Allergies:   No known allergies   Social History   Socioeconomic History   Marital status: Married    Spouse name: Not on file   Number of children: Not on file   Years of education: Not on file   Highest education level: Not on file  Occupational History   Not on file  Tobacco  Use   Smoking status: Former    Packs/day: 0.50    Years: 4.00    Total pack years: 2.00    Types: Cigarettes    Quit date: 07/16/2002    Years since quitting: 19.6   Smokeless tobacco: Never  Vaping Use   Vaping Use: Never used  Substance and Sexual Activity   Alcohol use: Yes    Alcohol/week: 6.0 standard drinks of alcohol    Types: 2 Shots of liquor, 4 Cans of beer per week    Comment: 12oz x2 of beer per day   Drug use: No   Sexual activity: Not  Currently  Other Topics Concern   Not on file  Social History Narrative   05-29-18 Unable to ask abuse questions today wife with him.   Social Determinants of Health   Financial Resource Strain: Low Risk  (04/21/2021)   Overall Financial Resource Strain (CARDIA)    Difficulty of Paying Living Expenses: Not hard at all  Food Insecurity: No Food Insecurity (04/21/2021)   Hunger Vital Sign    Worried About Running Out of Food in the Last Year: Never true    Ran Out of Food in the Last Year: Never true  Transportation Needs: No Transportation Needs (04/21/2021)   PRAPARE - Hydrologist (Medical): No    Lack of Transportation (Non-Medical): No  Physical Activity: Sufficiently Active (04/21/2021)   Exercise Vital Sign    Days of Exercise per Week: 7 days    Minutes of Exercise per Session: 60 min  Stress: No Stress Concern Present (04/21/2021)   [place]    Feeling of Stress : Not at all  Social Connections: Moderately Isolated (04/21/2021)   Social Connection and Isolation Panel [NHANES]    Frequency of Communication with Friends and Family: More than three times a week    Frequency of Social Gatherings with Friends and Family: Three times a week    Attends Religious Services: 1 to 4 times per year    Active Member of Clubs or Organizations: No    Attends Archivist Meetings: Never    Marital Status: Separated     Family History: The patient's family history includes Prostate cancer in his brother.  ROS:   Please see the history of present illness.    + palpitations All other systems reviewed and are negative.  Labs/Other Studies Reviewed:    The following studies were reviewed today:  Cardiac monitor 02/25/22  Patient had a min HR of 48 bpm, max HR of 169 bpm, and avg HR of 79 bpm. Predominant underlying rhythm was Sinus Rhythm.    EVENTS:   6 Supraventricular  Tachycardia runs occurred, the run with the fastest interval lasting 4 beats with a max rate of 169 bpm, the  longest lasting 6 beats with an avg rate of 130 bpm.    Isolated SVEs were rare (<1.0%), SVE Couplets were rare (<1.0%), and SVE Triplets were rare (<1.0%). Isolated VEs were rare (<1.0%), VE Couplets were rare (<1.0%), and no VE Triplets were present.   No atrial fibrillation, ventricular tachyarrhythmias, or bradyarrhythmias were detected.   No patient triggered events.  Echo 02/25/22  1. Left ventricular ejection fraction by 3D volume is 55 %. The left  ventricle has normal function. The left ventricle has no regional wall  motion abnormalities. Left ventricular diastolic parameters are consistent  with Grade I diastolic dysfunction  (impaired relaxation).  2. Right ventricular systolic function is normal. The right ventricular  size is normal.   3. The mitral valve is normal in structure. Trivial mitral valve  regurgitation. No evidence of mitral stenosis.   4. The aortic valve is tricuspid. Aortic valve regurgitation is trivial.  No aortic stenosis is present.   5. The inferior vena cava is normal in size with greater than 50%  respiratory variability, suggesting right atrial pressure of 3 mmHg.    Recent Labs: 02/10/2022: ALT 27; BUN 17; Creatinine, Ser 1.02; Hemoglobin 16.4; Platelets 206; Potassium 3.8; Sodium 139; TSH 1.091  Recent Lipid Panel No results found for: "CHOL", "TRIG", "HDL", "CHOLHDL", "VLDL", "LDLCALC", "LDLDIRECT"   Risk Assessment/Calculations:       Physical Exam:    VS:  BP 128/78   Pulse 75   Ht '5\' 11"'$  (1.803 m)   Wt 176 lb (79.8 kg)   SpO2 95%   BMI 24.55 kg/m     Wt Readings from Last 3 Encounters:  03/14/22 176 lb (79.8 kg)  02/12/22 177 lb 12.8 oz (80.6 kg)  02/14/21 171 lb 12.8 oz (77.9 kg)     GEN:  Well nourished, well developed in no acute distress HEENT: Normal NECK: No JVD; No carotid bruits CARDIAC: Irregular RR, no  murmurs, rubs, gallops RESPIRATORY:  Clear to auscultation without rales, wheezing or rhonchi  ABDOMEN: Soft, non-tender, non-distended MUSCULOSKELETAL:  No edema; No deformity. 2+ pedal pulses, equal bilaterally SKIN: Warm and dry NEUROLOGIC:  Alert and oriented x 3 PSYCHIATRIC:  Normal affect   EKG:  EKG is not ordered today.    Diagnoses:    1. Palpitations   2. SVT (supraventricular tachycardia) ([place])   3. Screening for hyperlipidemia    Assessment and Plan:     Palpitations: Occasional palpitations on Toprol XL 12.5 mg daily. Irregular beats, likely PVCs noted on exam.  Advised him to increase Toprol to 25 mg daily.  Advised him to call us if he does not tolerate this therapy. Continue to avoid stimulants and establish  good sleep habits.   SVT: 6 runs of SVT with longest run 6 beats at 130 bpm. Explained valsalva maneuvers that he can try if SVT recurs. Advised he may take additional Toprol for very fast HR. Call prior to next office visit if episodes increase.   Cardiac risk counseling/Screening for hyperlipidemia: There is no record of lipid panel. We discussed the importance of heart health and he would like to proceed with screening for dyslipidemia.  Encouraged 150 minutes of moderate intensity exercise each week.   Disposition: 6 months with Dr. Ali Lowe    Medication Adjustments/Labs and Tests Ordered: Current medicines are reviewed at length with the patient today.  Concerns regarding medicines are outlined above.  Orders Placed This Encounter  Procedures   Lipid Profile   Meds ordered this encounter  Medications   metoprolol succinate (TOPROL XL) 25 MG 24 hr tablet    Sig: Take 1 tablet (25 mg total) by mouth at bedtime.    Dispense:  90 tablet    Refill:  3    Patient Instructions  Medication Instructions:   INCREASE Toprol one (1) tablet by mouth ( 25 mg) daily.   *If you need a refill on your cardiac medications before your next appointment, please  call your pharmacy*   Lab Work:  TODAY!!!! LIPID  If you have labs (blood work) drawn today and your tests are completely normal, you will receive your results only by:  MyChart Message (if you have MyChart) OR A paper copy in the mail If you have any lab test that is abnormal or we need to change your treatment, we will call you to review the results.   Testing/Procedures:  None ordered.   Follow-Up: At [facility], you and your health needs are our priority.  As part of our continuing mission to provide you with exceptional heart care, we have created designated Provider Care Teams.  These Care Teams include your primary Cardiologist (physician) and Advanced Practice Providers (APPs -  Physician Assistants and Nurse Practitioners) who all work together to provide you with the care you need, when you need it.  We recommend signing up for the patient portal called "MyChart".  Sign up information is provided on this After Visit Summary.  MyChart is used to connect with patients for Virtual Visits (Telemedicine).  Patients are able to view lab/test results, encounter notes, upcoming appointments, etc.  Non-urgent messages can be sent to your provider as well.   To learn more about what you can do with MyChart, go to [email or web address].    Your next appointment:   6 month(s)  The format for your next appointment:   In Person  Provider:   Dr. Lenna Sciara  If primary card or EP is not listed click here to update    :1}    Other Instructions  Your physician wants you to follow-up in: 6 months with Dr.Thukkani.  You will receive a reminder letter in the mail two months in advance. If you don't receive a letter, please call our office to schedule the follow-up appointment.   Important Information About Sugar         Signed, Emmaline Life, NP  03/14/2022 3:56 PM    [facility]

## 2022-03-14 ENCOUNTER — Encounter: Payer: Self-pay | Admitting: Nurse Practitioner

## 2022-03-14 ENCOUNTER — Ambulatory Visit: Payer: Medicare Other | Admitting: Nurse Practitioner

## 2022-03-14 VITALS — BP 128/78 | HR 75 | Ht 71.0 in | Wt 176.0 lb

## 2022-03-14 DIAGNOSIS — I471 Supraventricular tachycardia: Secondary | ICD-10-CM | POA: Diagnosis not present

## 2022-03-14 DIAGNOSIS — K2990 Gastroduodenitis, unspecified, without bleeding: Secondary | ICD-10-CM | POA: Insufficient documentation

## 2022-03-14 DIAGNOSIS — R1033 Periumbilical pain: Secondary | ICD-10-CM | POA: Insufficient documentation

## 2022-03-14 DIAGNOSIS — R002 Palpitations: Secondary | ICD-10-CM | POA: Diagnosis not present

## 2022-03-14 DIAGNOSIS — Z860100 Personal history of colon polyps, unspecified: Secondary | ICD-10-CM | POA: Insufficient documentation

## 2022-03-14 DIAGNOSIS — Z13220 Encounter for screening for lipoid disorders: Secondary | ICD-10-CM | POA: Diagnosis not present

## 2022-03-14 DIAGNOSIS — B9681 Helicobacter pylori [H. pylori] as the cause of diseases classified elsewhere: Secondary | ICD-10-CM | POA: Insufficient documentation

## 2022-03-14 DIAGNOSIS — K269 Duodenal ulcer, unspecified as acute or chronic, without hemorrhage or perforation: Secondary | ICD-10-CM | POA: Insufficient documentation

## 2022-03-14 MED ORDER — METOPROLOL SUCCINATE ER 25 MG PO TB24: 25.0000 mg | ORAL_TABLET | Freq: Every day | ORAL | 3 refills | Status: DC

## 2022-03-14 NOTE — Patient Instructions (Signed)
Medication Instructions:   INCREASE Toprol one (1) tablet by mouth ( 25 mg) daily.   *If you need a refill on your cardiac medications before your next appointment, please call your pharmacy*   Lab Work:  TODAY!!!! LIPID  If you have labs (blood work) drawn today and your tests are completely normal, you will receive your results only by: [place] (if you have MyChart) OR A paper copy in the mail If you have any lab test that is abnormal or we need to change your treatment, we will call you to review the results.   Testing/Procedures:  None ordered.   Follow-Up: At [facility], you and your health needs are our priority.  As part of our continuing mission to provide you with exceptional heart care, we have created designated Provider Care Teams.  These Care Teams include your primary Cardiologist (physician) and Advanced Practice Providers (APPs -  Physician Assistants and Nurse Practitioners) who all work together to provide you with the care you need, when you need it.  We recommend signing up for the patient portal called "MyChart".  Sign up information is provided on this After Visit Summary.  MyChart is used to connect with patients for Virtual Visits (Telemedicine).  Patients are able to view lab/test results, encounter notes, upcoming appointments, etc.  Non-urgent messages can be sent to your provider as well.   To learn more about what you can do with MyChart, go to [email or web address].    Your next appointment:   6 month(s)  The format for your next appointment:   In Person  Provider:   Dr. Lenna Sciara  If primary card or EP is not listed click here to update    :1}    Other Instructions  Your physician wants you to follow-up in: 6 months with Dr.Thukkani.  You will receive a reminder letter in the mail two months in advance. If you don't receive a letter, please call our office to schedule the follow-up appointment.   Important Information About  Sugar

## 2022-03-15 LAB — LIPID PANEL
Chol/HDL Ratio: 4.1 ratio (ref 0.0–5.0)
Cholesterol, Total: 177 mg/dL (ref 100–199)
HDL: 43 mg/dL (ref >39)
LDL Chol Calc (NIH): 120 mg/dL — ABNORMAL HIGH (ref 0–99)
Triglycerides: 73 mg/dL (ref 0–149)
VLDL Cholesterol Cal: 14 mg/dL (ref 5–40)

## 2022-05-01 DIAGNOSIS — R1013 Epigastric pain: Secondary | ICD-10-CM | POA: Diagnosis not present

## 2022-05-01 DIAGNOSIS — B9681 Helicobacter pylori [H. pylori] as the cause of diseases classified elsewhere: Secondary | ICD-10-CM | POA: Diagnosis not present

## 2022-05-15 ENCOUNTER — Telehealth: Payer: Self-pay

## 2022-05-15 NOTE — Telephone Encounter (Signed)
Called patient  x3 with answer , patient may reschedule for the next available appointment .  L.Wilson,LPN

## 2022-05-20 NOTE — Telephone Encounter (Signed)
Left message for patient to call back and schedule Medicare Annual Wellness Visit (AWV).   Please offer to do virtually or by telephone.  Left office number and my jabber [phone].  Last AWV:04/21/2021  Please schedule at anytime with Nurse Health Advisor.

## 2022-05-28 DIAGNOSIS — M79604 Pain in right leg: Secondary | ICD-10-CM | POA: Diagnosis not present

## 2022-05-28 NOTE — Telephone Encounter (Signed)
Patient needs office visit with provider before he can have AWV.  Patient's last office visit was 11/30/2020.

## 2022-07-03 DIAGNOSIS — J069 Acute upper respiratory infection, unspecified: Secondary | ICD-10-CM | POA: Diagnosis not present

## 2022-07-10 IMAGING — DX DG HAND COMPLETE 3+V*L*
4 series · 4 of 4 positions shown · non-contrast
Comparison: None.

CLINICAL DATA: chronic generalized bilateral hand pain

EXAM:
RIGHT HAND - COMPLETE 3+ VIEW; LEFT HAND - COMPLETE 3+ VIEW

[hand ap]
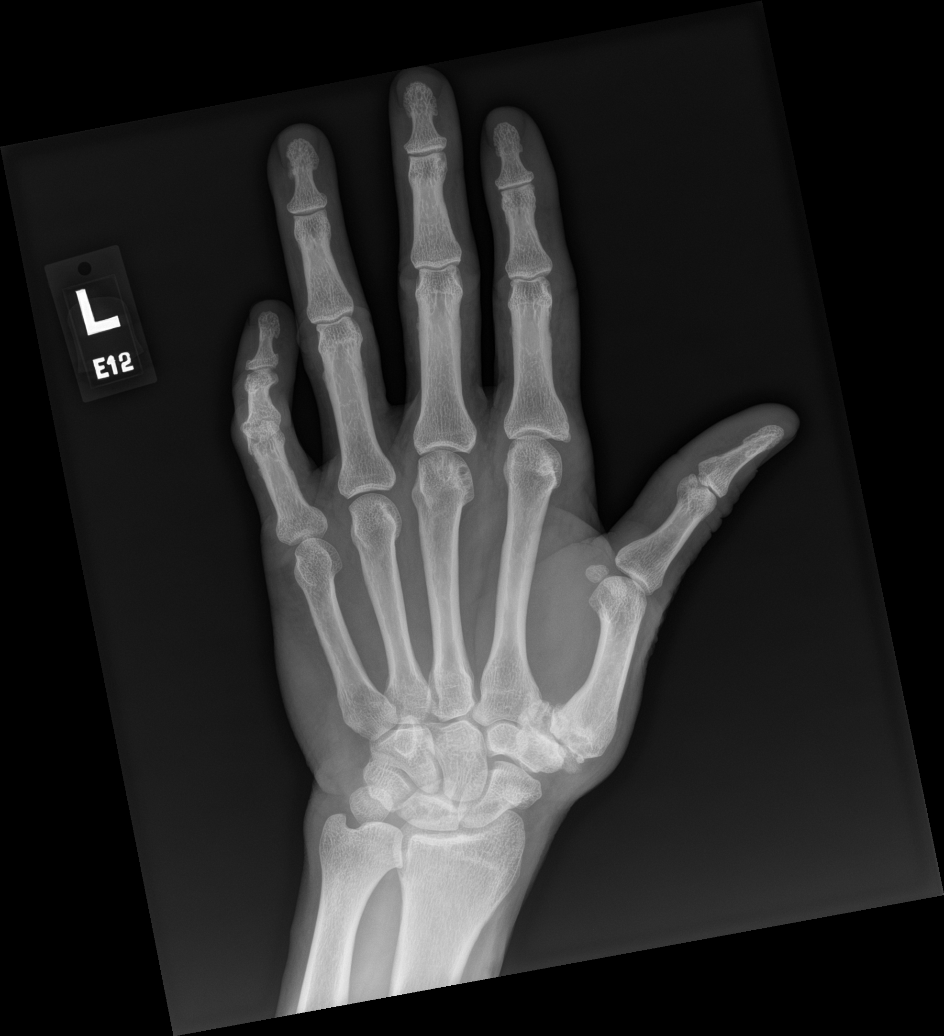

[hand obl]
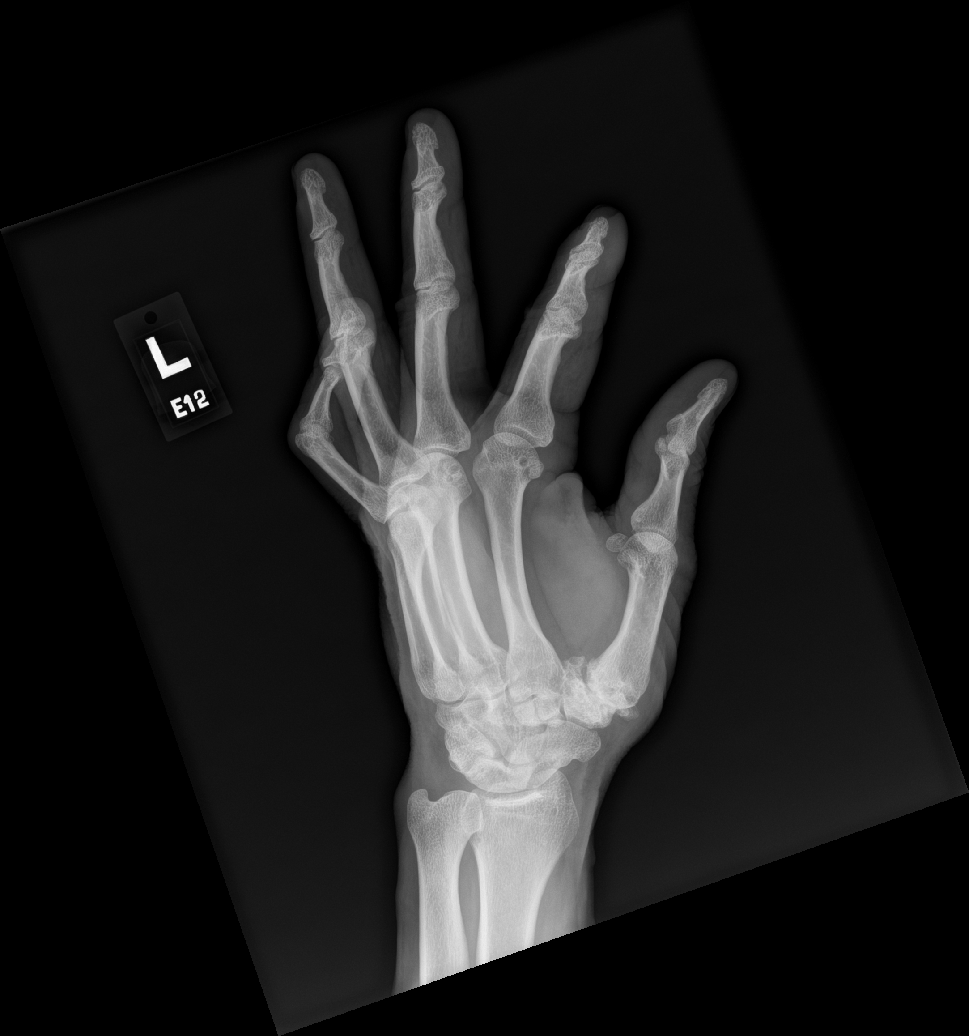

[hand lat (1 of 2)]
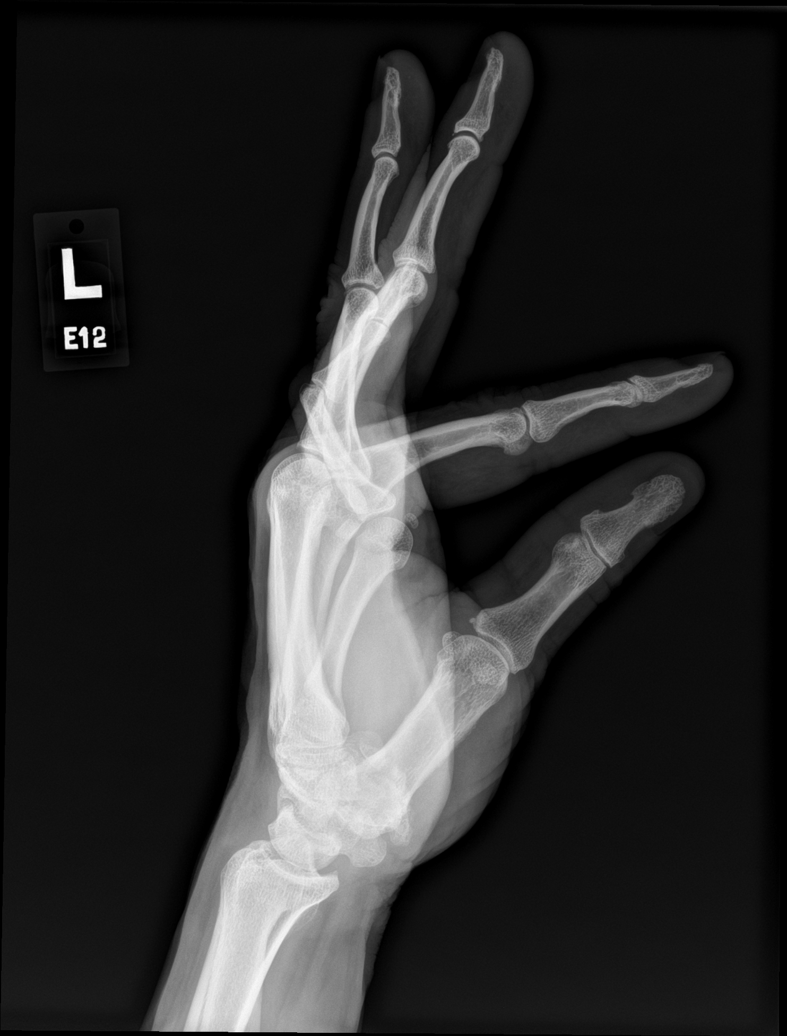

[hand lat (2 of 2)]
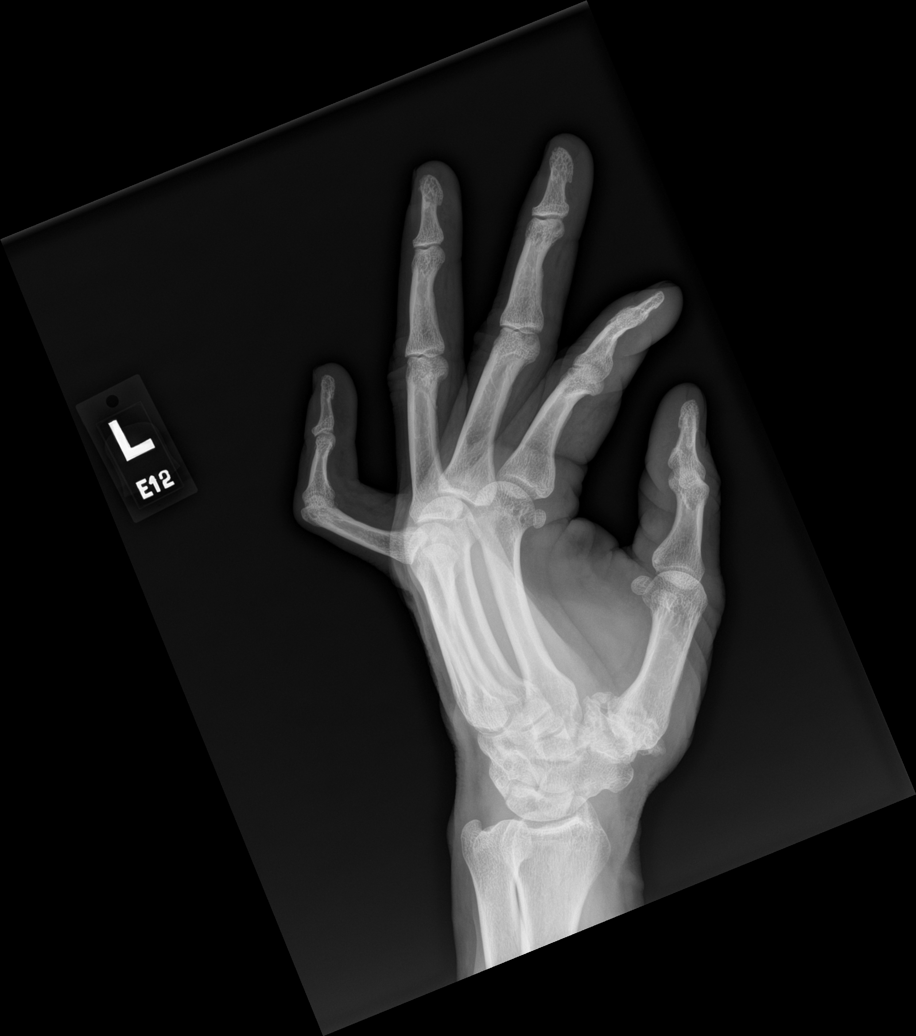

[4 of 4 positions shown; findings below may reference images not displayed]

FINDINGS: Right hand: No evidence of acute fracture. There is severe first
carpometacarpal joint degenerative change. Scattered mild
interphalangeal joint degenerative change. Subchondral cystic or
erosive change at the second and third MCP joints. No periostitis or
chondrocalcinosis.

Left hand: No evidence of acute fracture. Severe first
carpometacarpal joint degenerative change. There is flexion at the
and fifth proximal interphalangeal joint. There is subchondral
cystic or erosive change at the second and third MCP joints. No
periostitis or chondrocalcinosis.
IMPRESSION: Severe first carpometacarpal joint osteoarthritis bilaterally.

Possible flexion deformity of the fifth proximal interphalangeal
joint.

Subchondral cystic change versus early erosive change at the second
and third MCP joints bilaterally.

## 2022-07-10 IMAGING — DX DG KNEE AP/LAT W/ SUNRISE*L*
3 series · 3 of 3 positions shown · non-contrast
Comparison: None.

CLINICAL DATA: bilateral knee pain

EXAM:
LEFT KNEE 3 VIEWS

[knee ap]
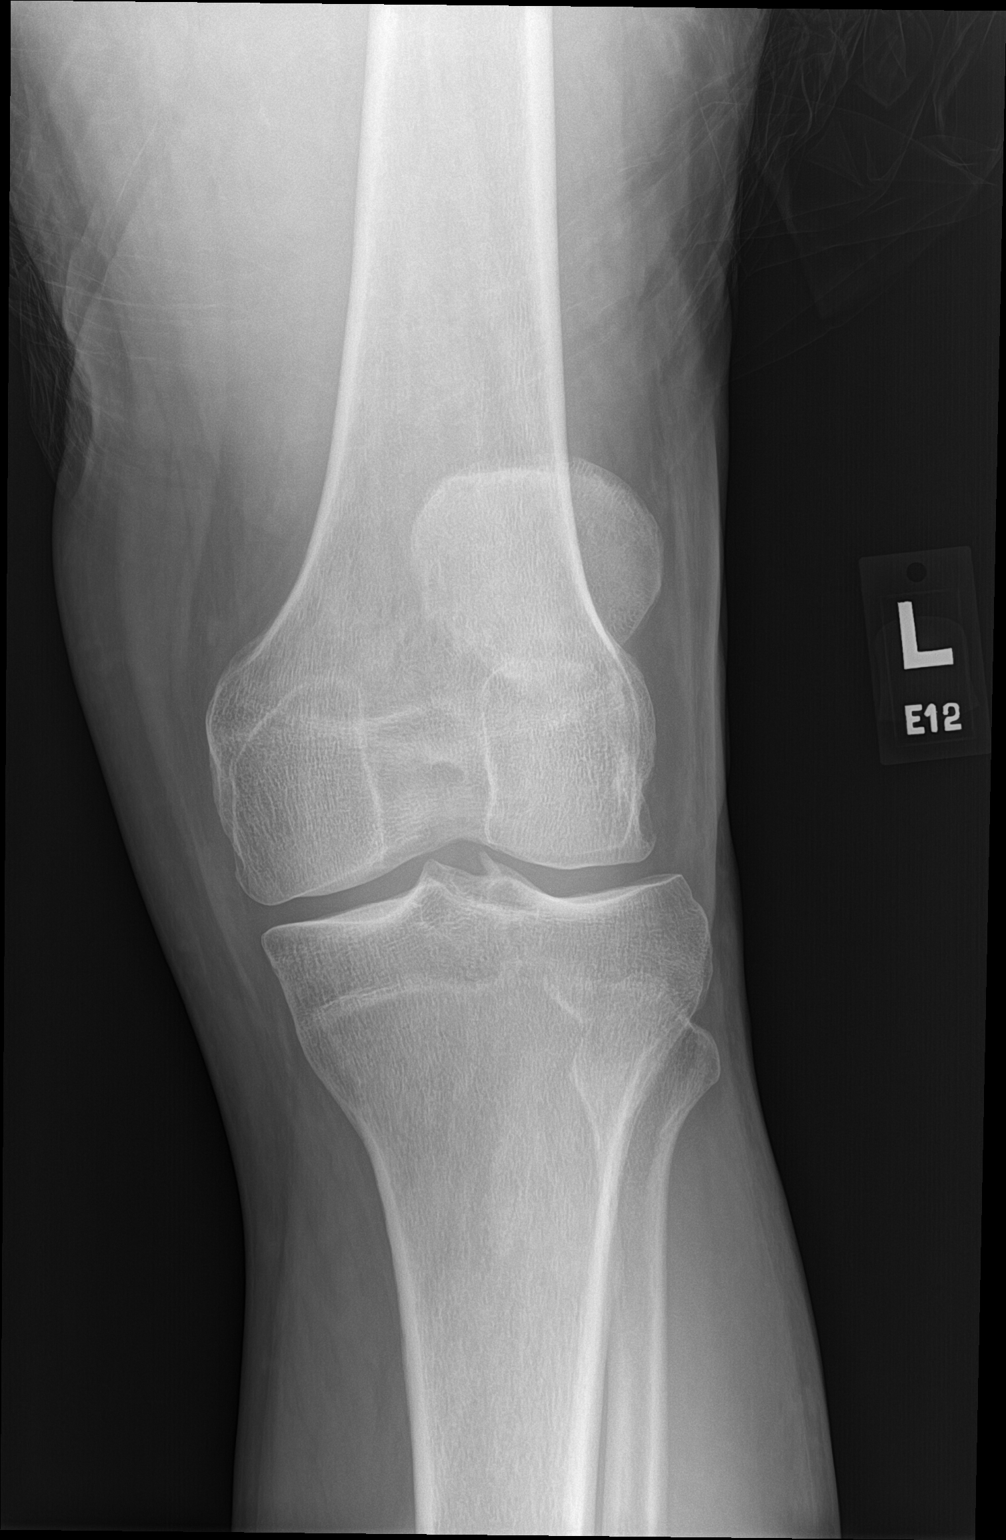

[knee lat]
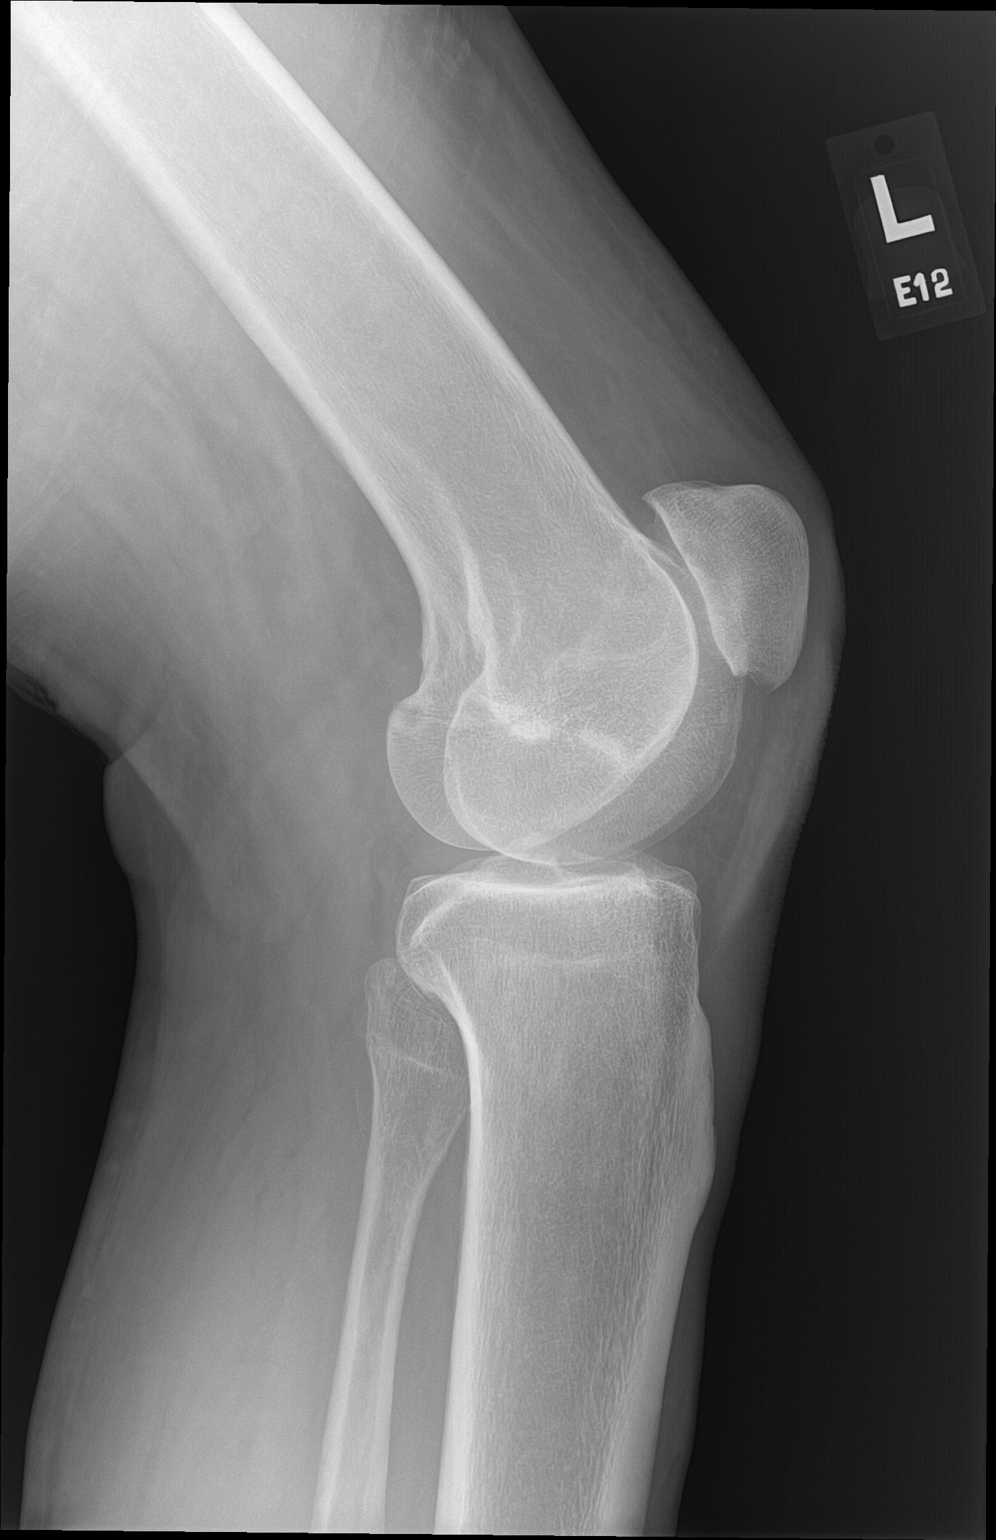

[patella]
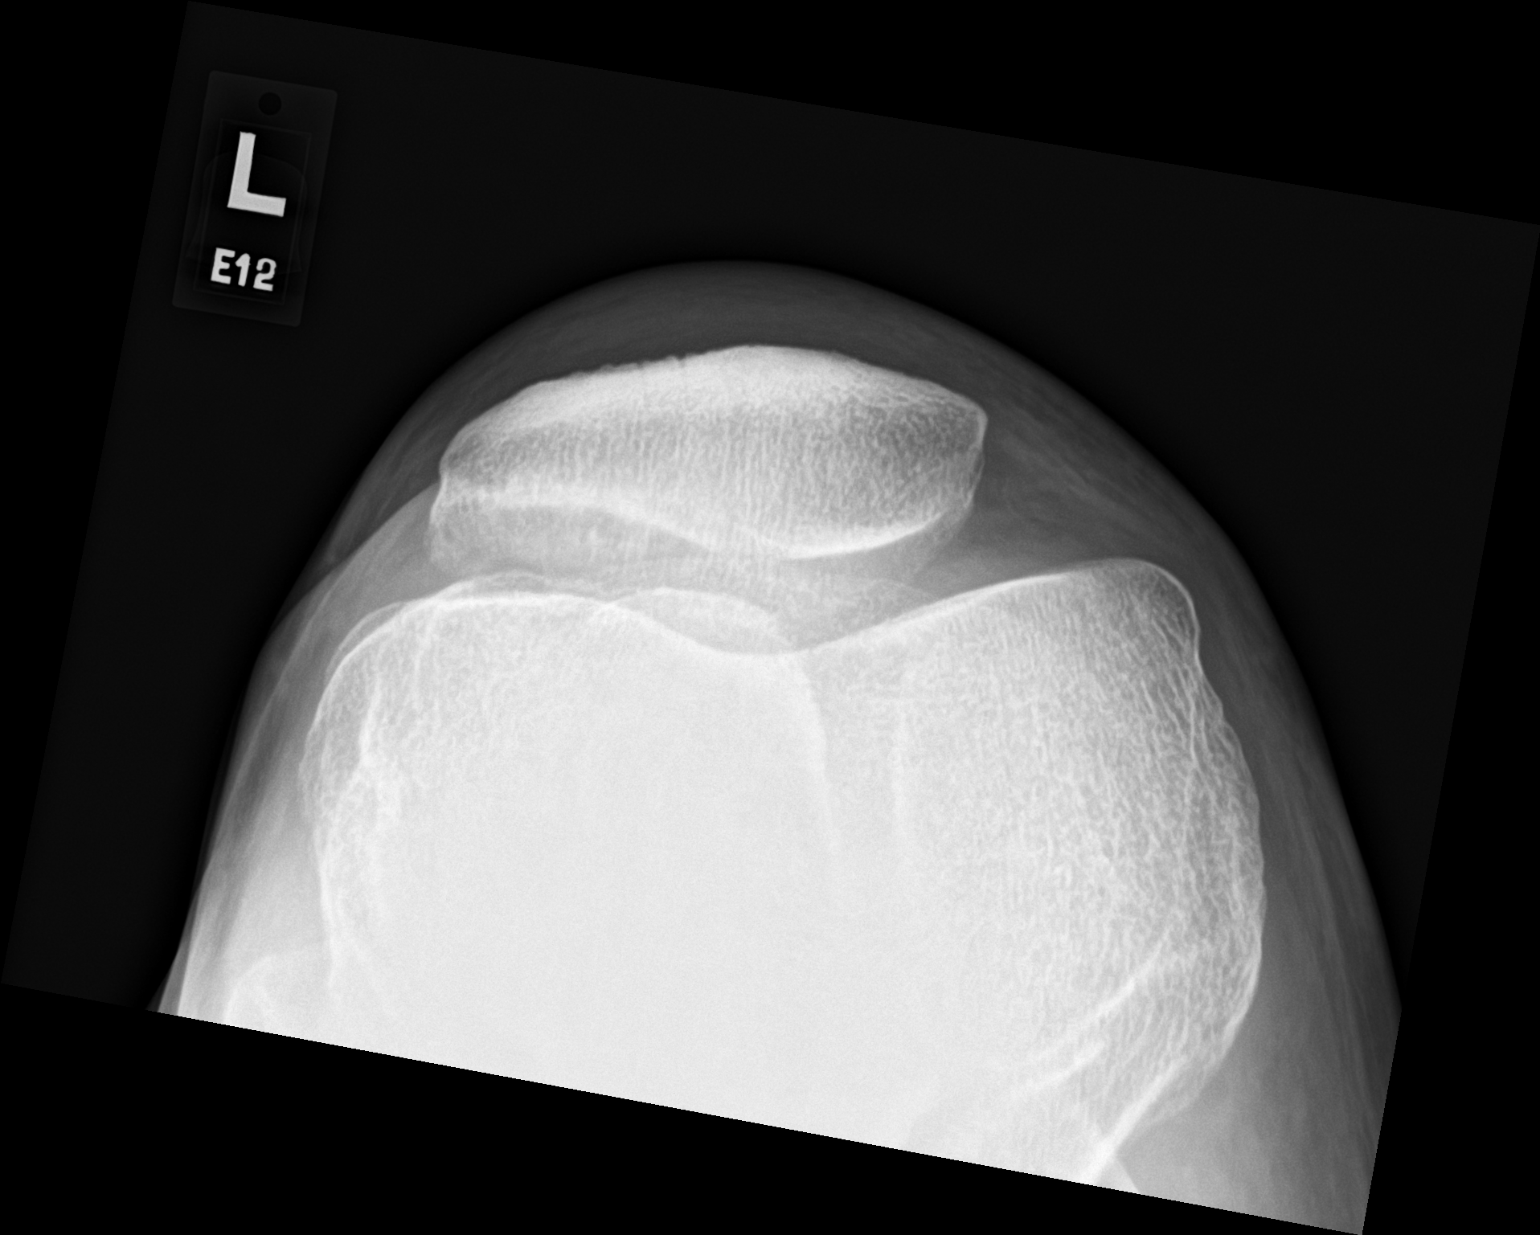

[3 of 3 positions shown; findings below may reference images not displayed]

FINDINGS: There is no evidence of fracture. There is minimal tricompartment
degenerative change. No significant joint effusion.
IMPRESSION: No evidence of fracture. Minimal tricompartment degenerative change.

## 2022-07-24 ENCOUNTER — Telehealth: Payer: Self-pay | Admitting: Radiation Oncology

## 2022-07-24 NOTE — Telephone Encounter (Signed)
Patient called to schedule f/u. Transferred to correct unit.

## 2022-08-14 DIAGNOSIS — K080 Exfoliation of teeth due to systemic causes: Secondary | ICD-10-CM | POA: Diagnosis not present

## 2022-09-05 DIAGNOSIS — C61 Malignant neoplasm of prostate: Secondary | ICD-10-CM | POA: Diagnosis not present

## 2022-09-08 DIAGNOSIS — M25511 Pain in right shoulder: Secondary | ICD-10-CM | POA: Diagnosis not present

## 2022-09-08 DIAGNOSIS — L84 Corns and callosities: Secondary | ICD-10-CM | POA: Diagnosis not present

## 2022-09-12 ENCOUNTER — Telehealth: Payer: Self-pay | Admitting: Nurse Practitioner

## 2022-09-12 DIAGNOSIS — N393 Stress incontinence (female) (male): Secondary | ICD-10-CM | POA: Diagnosis not present

## 2022-09-12 DIAGNOSIS — C61 Malignant neoplasm of prostate: Secondary | ICD-10-CM | POA: Diagnosis not present

## 2022-09-12 DIAGNOSIS — N5231 Erectile dysfunction following radical prostatectomy: Secondary | ICD-10-CM | POA: Diagnosis not present

## 2022-09-12 NOTE — Telephone Encounter (Signed)
Tried to call to see if patient still wants to see Ryan Wilson as his PCP or if he is seeing someone else so we can update his PCP. Lvm, If he wants to continue to be seen he needs to make sure to be seen since its been since 11/30/20

## 2022-09-23 NOTE — Progress Notes (Signed)
Cardiology Office Note:    Date:  10/01/2022   ID:  Ryan Wilson, DOB 1961/02/14, MRN [id number]  PCP:  Flossie Buffy, NP  [place] Providers Cardiologist:  Early Osmond, MD     Referring MD: Flossie Buffy, NP   Chief Complaint:  No chief complaint on file.     History of Present Illness:   Ryan Wilson is a 62 y.o. male  with history of palpitations with 6 runs of SVT and PVCs on monitor 01/2022 metoprolol increased to 25 mg once a day    Cardiac monitor revealed underlying sinus rhythm, 6 runs of SVT with longest lasting 6 beats, rare ectopic beats, no atrial fibrillation, no ventricular tachyarrhythmias or bradyarrhythmias, no patient triggered events. Echo revealed normal LVEF 55%, G1DD, normal RV, no rwma, no significant valve abnormality.   Patient comes in for f/u. Ran out of meds 3 months ago. When he arrived his HR was 146/m but when we did EKG HR 76. He feels like something is pulling in his chest when laying in bed and moves his arm. He denies chest pressure tightness with activity. Pain is in left chest and he wants a CTA. Stays active all day long.  Gets short of breath in the heat and has to stop. Denies palpitations.     Past Medical History:  Diagnosis Date   Back pain    Heart murmur    PER PATIENT WAS HEARD BY A NURSE; DENIES SYMPTOMS     Inguinal hernia    REPAIRED  IN 1990s   Prostate cancer ([place])    Current Medications: No outpatient medications have been marked as taking for the 10/01/22 encounter (Office Visit) with Imogene Burn, PA-C.    Allergies:   No known allergies   Social History   Tobacco Use   Smoking status: Former    Packs/day: 0.50    Years: 4.00    Total pack years: 2.00    Types: Cigarettes    Quit date: 07/16/2002    Years since quitting: 20.2   Smokeless tobacco: Never  Vaping Use   Vaping Use: Never used  Substance Use Topics   Alcohol use: Yes    Alcohol/week: 6.0 standard drinks of alcohol     Types: 2 Shots of liquor, 4 Cans of beer per week    Comment: 12oz x2 of beer per day   Drug use: No    Family Hx: The patient's family history includes Prostate cancer in his brother.  ROS     Physical Exam:    VS:  BP 124/88   Pulse (!) 146   Ht '5\' 11"'$  (1.803 m)   Wt 179 lb 3.2 oz (81.3 kg)   SpO2 98%   BMI 24.99 kg/m     Wt Readings from Last 3 Encounters:  10/01/22 179 lb 3.2 oz (81.3 kg)  03/14/22 176 lb (79.8 kg)  02/12/22 177 lb 12.8 oz (80.6 kg)    Physical Exam  GEN: Thin, in no acute distress  Neck: no JVD, carotid bruits, or masses Cardiac:RRR; no murmurs, rubs, or gallops  Respiratory:  clear to auscultation bilaterally, normal work of breathing GI: soft, nontender, nondistended, + BS Ext: without cyanosis, clubbing, or edema, Good distal pulses bilaterally Neuro:  Alert and Oriented x 3,  Psych: euthymic mood, full affect        EKGs/Labs/Other Test Reviewed:    EKG:  EKG is   ordered today.  The ekg ordered  today demonstrates NSR normal EKG  Recent Labs: 02/10/2022: ALT 27; BUN 17; Creatinine, Ser 1.02; Hemoglobin 16.4; Platelets 206; Potassium 3.8; Sodium 139; TSH 1.091   Recent Lipid Panel Recent Labs    03/14/22 1559  CHOL 177  TRIG 73  HDL 43  LDLCALC 120*     Prior CV Studies:     Monitor 02/25/22   Patient had a min HR of 48 bpm, max HR of 169 bpm, and avg HR of 79 bpm. Predominant underlying rhythm was Sinus Rhythm.    EVENTS:   6 Supraventricular Tachycardia runs occurred, the run with the fastest interval lasting 4 beats with a max rate of 169 bpm, the  longest lasting 6 beats with an avg rate of 130 bpm.    Isolated SVEs were rare (<1.0%), SVE Couplets were rare (<1.0%), and SVE Triplets were rare (<1.0%). Isolated VEs were rare (<1.0%), VE Couplets were rare (<1.0%), and no VE Triplets were present.   No atrial fibrillation, ventricular tachyarrhythmias, or bradyarrhythmias were detected.   No patient triggered events.    Echo 02/25/22   1. Left ventricular ejection fraction by 3D volume is 55 %. The left  ventricle has normal function. The left ventricle has no regional wall  motion abnormalities. Left ventricular diastolic parameters are consistent  with Grade I diastolic dysfunction  (impaired relaxation).   2. Right ventricular systolic function is normal. The right ventricular  size is normal.   3. The mitral valve is normal in structure. Trivial mitral valve  regurgitation. No evidence of mitral stenosis.   4. The aortic valve is tricuspid. Aortic valve regurgitation is trivial.  No aortic stenosis is present.   5. The inferior vena cava is normal in size with greater than 50%  respiratory variability, suggesting right atrial pressure of 3 mmHg.     Risk Assessment/Calculations/Metrics:              ASSESSMENT & PLAN:   No problem-specific Assessment & Plan notes found for this encounter.   Chest pain somewhat atypical but going on for awhile and anxious he has CAD. Will order Coronary CTA/FFR  Palpitations with PVCs   History of SVT ran out Toprol-XL 3 months ago and HR 146 on arrival-patient asymptomatic, EKG NSR normal EKG. Will resume Toprol XL. Check labs today  HLD LDL 120 02/2022 not on statin. Await CCTA before treating.        Shared Decision Making/Informed Consent{    Dispo:  No follow-ups on file.   Medication Adjustments/Labs and Tests Ordered: Current medicines are reviewed at length with the patient today.  Concerns regarding medicines are outlined above.  Tests Ordered: Orders Placed This Encounter  Procedures   CT CORONARY MORPH W/CTA COR W/SCORE W/CA W/CM &/OR WO/CM   CBC   Comprehensive metabolic panel   TSH   Medication Changes: Meds ordered this encounter  Medications   metoprolol succinate (TOPROL XL) 25 MG 24 hr tablet    Sig: Take 1 tablet (25 mg total) by mouth at bedtime.    Dispense:  90 tablet    Refill:  3   Signed, Ermalinda Barrios, PA-C   10/01/2022 8:42 AM    [address], [place], [place]  [address] Phone: [phone]; Fax: [phone]

## 2022-09-26 DIAGNOSIS — M21612 Bunion of left foot: Secondary | ICD-10-CM | POA: Diagnosis not present

## 2022-09-26 DIAGNOSIS — M79671 Pain in right foot: Secondary | ICD-10-CM | POA: Diagnosis not present

## 2022-09-26 DIAGNOSIS — M71571 Other bursitis, not elsewhere classified, right ankle and foot: Secondary | ICD-10-CM | POA: Diagnosis not present

## 2022-09-26 DIAGNOSIS — M21611 Bunion of right foot: Secondary | ICD-10-CM | POA: Diagnosis not present

## 2022-09-26 DIAGNOSIS — M79672 Pain in left foot: Secondary | ICD-10-CM | POA: Diagnosis not present

## 2022-10-01 ENCOUNTER — Encounter: Payer: Self-pay | Admitting: Physician Assistant

## 2022-10-01 ENCOUNTER — Ambulatory Visit: Payer: Medicare Other | Attending: Physician Assistant | Admitting: Physician Assistant

## 2022-10-01 VITALS — BP 124/88 | HR 146 | Ht 71.0 in | Wt 179.2 lb

## 2022-10-01 DIAGNOSIS — R002 Palpitations: Secondary | ICD-10-CM | POA: Diagnosis not present

## 2022-10-01 DIAGNOSIS — I471 Supraventricular tachycardia, unspecified: Secondary | ICD-10-CM

## 2022-10-01 DIAGNOSIS — R079 Chest pain, unspecified: Secondary | ICD-10-CM | POA: Diagnosis not present

## 2022-10-01 DIAGNOSIS — R0789 Other chest pain: Secondary | ICD-10-CM

## 2022-10-01 DIAGNOSIS — E785 Hyperlipidemia, unspecified: Secondary | ICD-10-CM

## 2022-10-01 MED ORDER — METOPROLOL SUCCINATE ER 25 MG PO TB24: 25.0000 mg | ORAL_TABLET | Freq: Every day | ORAL | 3 refills | Status: DC

## 2022-10-01 NOTE — Addendum Note (Signed)
Addended by: Michelle Nasuti on: 10/01/2022 09:29 AM   Modules accepted: Orders

## 2022-10-01 NOTE — Patient Instructions (Addendum)
Medication Instructions:  Your physician recommends that you continue on your current medications as directed. Please refer to the Current Medication list given to you today.  *If you need a refill on your cardiac medications before your next appointment, please call your pharmacy*   Lab Work: CMP,TSH, CBC- today   If you have labs (blood work) drawn today and your tests are completely normal, you will receive your results only by: [place] (if you have MyChart) OR A paper copy in the mail If you have any lab test that is abnormal or we need to change your treatment, we will call you to review the results.   Testing/Procedures: Your physician has requested that you have a coronary CTA   Follow-Up: At [facility], you and your health needs are our priority.  As part of our continuing mission to provide you with exceptional heart care, we have created designated Provider Care Teams.  These Care Teams include your primary Cardiologist (physician) and Advanced Practice Providers (APPs -  Physician Assistants and Nurse Practitioners) who all work together to provide you with the care you need, when you need it.  We recommend signing up for the patient portal called "MyChart".  Sign up information is provided on this After Visit Summary.  MyChart is used to connect with patients for Virtual Visits (Telemedicine).  Patients are able to view lab/test results, encounter notes, upcoming appointments, etc.  Non-urgent messages can be sent to your provider as well.   To learn more about what you can do with MyChart, go to [email or web address].    Your next appointment:   12 month(s)  Provider:   Early Osmond, MD     Other Instructions   Your cardiac CT will be scheduled at one of the below locations:   [facility] [address] [place], [place] [address] [phone]  If scheduled at [facility], please arrive at the [facility]  Entrance (Entrance C2) of [facility] 30 minutes prior to test start time. You can use the FREE valet parking offered at entrance C (encouraged to control the heart rate for the test)  Proceed to the [facility] Radiology Department (first floor) to check-in and test prep.  All radiology patients and guests should use entrance C2 at [facility], accessed from [facility], even though the hospital's physical address listed is [address].     Please follow these instructions carefully (unless otherwise directed):  Hold all erectile dysfunction medications at least 3 days (72 hrs) prior to test. (Ie viagra, cialis, sildenafil, tadalafil, etc) We will administer nitroglycerin during this exam.   On the Night Before the Test: Be sure to Drink plenty of water. Do not consume any caffeinated/decaffeinated beverages or chocolate 12 hours prior to your test. Do not take any antihistamines 12 hours prior to your test.  On the Day of the Test: Drink plenty of water until 1 hour prior to the test. Do not eat any food 1 hour prior to test. You may take your regular medications prior to the test.  Take metoprolol  two hours prior to test.       After the Test: Drink plenty of water. After receiving IV contrast, you may experience a mild flushed feeling. This is normal. On occasion, you may experience a mild rash up to 24 hours after the test. This is not dangerous. If this occurs, you can take Benadryl 25 mg and  increase your fluid intake. If you experience trouble breathing, this can be serious. If it is severe call 911 IMMEDIATELY. If it is mild, please call our office. If you take any of these medications: Glipizide/Metformin, Avandament, Glucavance, please do not take 48 hours after completing test unless otherwise instructed.  We will call to schedule your test 2-4 weeks out understanding that some insurance companies will need an authorization prior to  the service being performed.   For non-scheduling related questions, please contact the cardiac imaging nurse navigator should you have any questions/concerns: Marchia Bond, Cardiac Imaging Nurse Navigator Gordy Clement, Cardiac Imaging Nurse Navigator [facility] Direct Office Dial: [phone]   For scheduling needs, including cancellations and rescheduling, please call [place], [phone].

## 2022-10-02 LAB — CBC
Hematocrit: 49.7 % (ref 37.5–51.0)
Hemoglobin: 17.2 g/dL (ref 13.0–17.7)
MCH: 31.7 pg (ref 26.6–33.0)
MCHC: 34.6 g/dL (ref 31.5–35.7)
MCV: 92 fL (ref 79–97)
Platelets: 217 10*3/uL (ref 150–450)
RBC: 5.42 x10E6/uL (ref 4.14–5.80)
RDW: 12.5 % (ref 11.6–15.4)
WBC: 5.1 10*3/uL (ref 3.4–10.8)

## 2022-10-02 LAB — COMPREHENSIVE METABOLIC PANEL
ALT: 20 IU/L (ref 0–44)
AST: 18 IU/L (ref 0–40)
Albumin/Globulin Ratio: 2.2 (ref 1.2–2.2)
Albumin: 4.4 g/dL (ref 3.8–4.9)
Alkaline Phosphatase: 72 IU/L (ref 44–121)
BUN/Creatinine Ratio: 14 (ref 9–20)
BUN: 16 mg/dL (ref 6–24)
Bilirubin Total: 0.3 mg/dL (ref 0.0–1.2)
CO2: 25 mmol/L (ref 20–29)
Calcium: 9.8 mg/dL (ref 8.7–10.2)
Chloride: 103 mmol/L (ref 96–106)
Creatinine, Ser: 1.14 mg/dL (ref 0.76–1.27)
Globulin, Total: 2 g/dL (ref 1.5–4.5)
Glucose: 95 mg/dL (ref 70–99)
Potassium: 4.1 mmol/L (ref 3.5–5.2)
Sodium: 141 mmol/L (ref 134–144)
Total Protein: 6.4 g/dL (ref 6.0–8.5)
eGFR: 74 mL/min/{1.73_m2} (ref >59)

## 2022-10-02 LAB — TSH: TSH: 1.42 u[IU]/mL (ref 0.450–4.500)

## 2022-10-04 ENCOUNTER — Telehealth (HOSPITAL_COMMUNITY): Payer: Self-pay | Admitting: *Deleted

## 2022-10-04 NOTE — Telephone Encounter (Signed)
Reaching out to patient to offer assistance regarding upcoming cardiac imaging study; pt verbalizes understanding of appt date/time, parking situation and where to check in, pre-test NPO status and medications ordered, and verified current allergies; name and call back number provided for further questions should they arise  Ryan Clement RN Navigator Cardiac Imaging [facility] [phone] office [phone] cell  Patient to take an extra dose of metoprolol two hours prior to his cardiac CT scan. He is aware to arrive at 12pm.

## 2022-10-08 ENCOUNTER — Ambulatory Visit (HOSPITAL_COMMUNITY)
Admission: RE | Admit: 2022-10-08 | Discharge: 2022-10-08 | Disposition: A | Discharge status: Home / Self Care | Payer: Medicare Other | Source: Ambulatory Visit | Attending: Physician Assistant | Admitting: Physician Assistant

## 2022-10-08 DIAGNOSIS — R0609 Other forms of dyspnea: Secondary | ICD-10-CM | POA: Insufficient documentation

## 2022-10-08 DIAGNOSIS — R0602 Shortness of breath: Secondary | ICD-10-CM | POA: Diagnosis not present

## 2022-10-08 DIAGNOSIS — R0789 Other chest pain: Secondary | ICD-10-CM | POA: Insufficient documentation

## 2022-10-08 DIAGNOSIS — I471 Supraventricular tachycardia, unspecified: Secondary | ICD-10-CM | POA: Insufficient documentation

## 2022-10-08 DIAGNOSIS — I509 Heart failure, unspecified: Secondary | ICD-10-CM | POA: Insufficient documentation

## 2022-10-08 DIAGNOSIS — E785 Hyperlipidemia, unspecified: Secondary | ICD-10-CM | POA: Diagnosis not present

## 2022-10-08 DIAGNOSIS — I493 Ventricular premature depolarization: Secondary | ICD-10-CM | POA: Diagnosis not present

## 2022-10-08 MED ORDER — NITROGLYCERIN 0.4 MG SL SUBL
SUBLINGUAL_TABLET | SUBLINGUAL | Status: AC
  Filled 2022-10-08: qty 2

## 2022-10-08 MED ORDER — NITROGLYCERIN 0.4 MG SL SUBL
0.8000 mg | SUBLINGUAL_TABLET | Freq: Once | SUBLINGUAL | Status: AC
  Administered 2022-10-08: 0.8 mg via SUBLINGUAL

## 2022-10-08 MED ORDER — IOHEXOL 350 MG/ML SOLN
100.0000 mL | Freq: Once | INTRAVENOUS | Status: AC | PRN
  Administered 2022-10-08: 100 mL via INTRAVENOUS

## 2023-02-03 DIAGNOSIS — C189 Malignant neoplasm of colon, unspecified: Secondary | ICD-10-CM | POA: Diagnosis not present

## 2023-05-31 ENCOUNTER — Encounter (HOSPITAL_COMMUNITY): Payer: Self-pay

## 2023-05-31 ENCOUNTER — Ambulatory Visit (HOSPITAL_COMMUNITY)
Admission: RE | Admit: 2023-05-31 | Discharge: 2023-05-31 | Disposition: A | Discharge status: Home / Self Care | Payer: Medicare Other | Source: Ambulatory Visit | Attending: Emergency Medicine | Admitting: Emergency Medicine

## 2023-05-31 VITALS — BP 112/74 | HR 77 | Temp 97.8°F | Resp 16

## 2023-05-31 DIAGNOSIS — S161XXA Strain of muscle, fascia and tendon at neck level, initial encounter: Secondary | ICD-10-CM

## 2023-05-31 DIAGNOSIS — S39012A Strain of muscle, fascia and tendon of lower back, initial encounter: Secondary | ICD-10-CM | POA: Diagnosis not present

## 2023-05-31 MED ORDER — KETOROLAC TROMETHAMINE 30 MG/ML IJ SOLN
INTRAMUSCULAR | Status: AC
  Filled 2023-05-31: qty 1

## 2023-05-31 MED ORDER — KETOROLAC TROMETHAMINE 30 MG/ML IJ SOLN
30.0000 mg | Freq: Once | INTRAMUSCULAR | Status: AC
  Administered 2023-05-31: 30 mg via INTRAMUSCULAR

## 2023-05-31 MED ORDER — IBUPROFEN 800 MG PO TABS: 800.0000 mg | ORAL_TABLET | Freq: Three times a day (TID) | ORAL | 0 refills | Status: DC

## 2023-05-31 MED ORDER — TIZANIDINE HCL 4 MG PO TABS: 4.0000 mg | ORAL_TABLET | Freq: Three times a day (TID) | ORAL | 0 refills | Status: DC | PRN

## 2023-05-31 MED ORDER — TRAMADOL HCL 50 MG PO TABS: 50.0000 mg | ORAL_TABLET | Freq: Four times a day (QID) | ORAL | 0 refills | Status: AC | PRN
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
## 2023-05-31 NOTE — Discharge Instructions (Addendum)
Muscle relaxant may cause drowsiness.  Use this medication with caution as it may interfere with ability to drive or operate heavy equipment. NSAIDs twice daily with food.  May take Tylenol at 1000 mg in between NSAIDs. Gentle stretches as tolerated.  Hold each stretch for 30 seconds Consider massage therapy Consider topical ointments such as Biofreeze or Salonpas patches.
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
## 2023-08-02 DIAGNOSIS — H40033 Anatomical narrow angle, bilateral: Secondary | ICD-10-CM | POA: Diagnosis not present

## 2023-08-02 DIAGNOSIS — H2513 Age-related nuclear cataract, bilateral: Secondary | ICD-10-CM | POA: Diagnosis not present

## 2023-09-08 DIAGNOSIS — C61 Malignant neoplasm of prostate: Secondary | ICD-10-CM | POA: Diagnosis not present

## 2023-09-15 DIAGNOSIS — N393 Stress incontinence (female) (male): Secondary | ICD-10-CM | POA: Diagnosis not present

## 2023-09-15 DIAGNOSIS — C61 Malignant neoplasm of prostate: Secondary | ICD-10-CM | POA: Diagnosis not present

## 2023-09-15 DIAGNOSIS — N5231 Erectile dysfunction following radical prostatectomy: Secondary | ICD-10-CM | POA: Diagnosis not present

## 2023-10-15 ENCOUNTER — Ambulatory Visit: Payer: Medicare Other | Admitting: Nurse Practitioner

## 2023-10-15 ENCOUNTER — Ambulatory Visit (INDEPENDENT_AMBULATORY_CARE_PROVIDER_SITE_OTHER)

## 2023-10-15 ENCOUNTER — Ambulatory Visit: Admitting: Nurse Practitioner

## 2023-10-15 ENCOUNTER — Ambulatory Visit: Attending: Nurse Practitioner

## 2023-10-15 VITALS — BP 100/70 | Temp 97.9°F | Ht 70.0 in | Wt 182.2 lb

## 2023-10-15 DIAGNOSIS — M25511 Pain in right shoulder: Secondary | ICD-10-CM

## 2023-10-15 DIAGNOSIS — R8290 Unspecified abnormal findings in urine: Secondary | ICD-10-CM | POA: Diagnosis not present

## 2023-10-15 DIAGNOSIS — G8929 Other chronic pain: Secondary | ICD-10-CM

## 2023-10-15 DIAGNOSIS — E785 Hyperlipidemia, unspecified: Secondary | ICD-10-CM

## 2023-10-15 DIAGNOSIS — I471 Supraventricular tachycardia, unspecified: Secondary | ICD-10-CM

## 2023-10-15 DIAGNOSIS — M25512 Pain in left shoulder: Secondary | ICD-10-CM

## 2023-10-15 DIAGNOSIS — R109 Unspecified abdominal pain: Secondary | ICD-10-CM

## 2023-10-15 DIAGNOSIS — M19012 Primary osteoarthritis, left shoulder: Secondary | ICD-10-CM | POA: Diagnosis not present

## 2023-10-15 LAB — URINALYSIS
Bilirubin Urine: NEGATIVE
Hgb urine dipstick: NEGATIVE
Ketones, ur: NEGATIVE
Leukocytes,Ua: NEGATIVE
Nitrite: NEGATIVE
Specific Gravity, Urine: 1.02 (ref 1.000–1.030)
Total Protein, Urine: NEGATIVE
Urine Glucose: NEGATIVE
Urobilinogen, UA: 1 (ref 0.0–1.0)
pH: 7 (ref 5.0–8.0)

## 2023-10-15 LAB — COMPREHENSIVE METABOLIC PANEL
ALT: 23 U/L (ref 0–53)
AST: 21 U/L (ref 0–37)
Albumin: 4.7 g/dL (ref 3.5–5.2)
Alkaline Phosphatase: 64 U/L (ref 39–117)
BUN: 16 mg/dL (ref 6–23)
CO2: 30 meq/L (ref 19–32)
Calcium: 9.7 mg/dL (ref 8.4–10.5)
Chloride: 104 meq/L (ref 96–112)
Creatinine, Ser: 0.85 mg/dL (ref 0.40–1.50)
GFR: 94.43 mL/min (ref >60.00)
Glucose, Bld: 108 mg/dL — ABNORMAL HIGH (ref 70–99)
Potassium: 3.9 meq/L (ref 3.5–5.1)
Sodium: 140 meq/L (ref 135–145)
Total Bilirubin: 0.6 mg/dL (ref 0.2–1.2)
Total Protein: 7.3 g/dL (ref 6.0–8.3)

## 2023-10-15 LAB — CBC
HCT: 51.2 % (ref 39.0–52.0)
Hemoglobin: 17.6 g/dL — ABNORMAL HIGH (ref 13.0–17.0)
MCHC: 34.4 g/dL (ref 30.0–36.0)
MCV: 93.1 fl (ref 78.0–100.0)
Platelets: 208 10*3/uL (ref 150.0–400.0)
RBC: 5.5 Mil/uL (ref 4.22–5.81)
RDW: 12.7 % (ref 11.5–15.5)
WBC: 5.6 10*3/uL (ref 4.0–10.5)

## 2023-10-15 LAB — LIPID PANEL
Cholesterol: 184 mg/dL (ref 0–200)
HDL: 46.7 mg/dL (ref >39.00)
LDL Cholesterol: 124 mg/dL — ABNORMAL HIGH (ref 0–99)
NonHDL: 137.49
Total CHOL/HDL Ratio: 4
Triglycerides: 65 mg/dL (ref 0.0–149.0)
VLDL: 13 mg/dL (ref 0.0–40.0)

## 2023-10-15 LAB — HEMOGLOBIN A1C: Hgb A1c MFr Bld: 6.1 % (ref 4.6–6.5)

## 2023-10-15 MED ORDER — DICLOFENAC SODIUM 1 % EX GEL: 2.0000 g | Freq: Four times a day (QID) | CUTANEOUS | 1 refills | Status: DC

## 2023-10-15 NOTE — Assessment & Plan Note (Signed)
 Chronic, etiology unclear Check CT scan of abdomen and pelvis Refer to GI for further assistance with evaluation as well.

## 2023-10-15 NOTE — Assessment & Plan Note (Signed)
 Chronic Labs ordered, further recommendations may be made based upon his results.

## 2023-10-15 NOTE — Progress Notes (Unsigned)
 EP to read.

## 2023-10-15 NOTE — Assessment & Plan Note (Signed)
 Chronic Appears to be consistent with musculoskeletal in origin and as it is reproducible with movement. Check bilateral x-rays of the shoulder and refer to orthopedics today. Because of current abdominal pain and history of gastric ulcer would recommend against using oral NSAIDs at this time until further evaluated by GI.  He can use Tylenol and will also prescribe Voltaren gel that he can take as needed.

## 2023-10-15 NOTE — Assessment & Plan Note (Signed)
 Chronic, no recent symptoms. Check long-term cardiac monitor, if still having runs of SVT would recommend he restart metoprolol.

## 2023-10-15 NOTE — Assessment & Plan Note (Signed)
 Etiology unclear Recommend hydrating regularly throughout the day Check urine for UTI as well as screen for STIs.  Further recommendations may be made based upon these results.
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
## 2023-10-17 LAB — ANA: Anti Nuclear Antibody (ANA): NEGATIVE

## 2023-10-17 LAB — CHLAMYDIA/NEISSERIA GONORRHOEAE RNA,TMA,UROGENTIAL
C. trachomatis RNA, TMA: NOT DETECTED
N. gonorrhoeae RNA, TMA: NOT DETECTED

## 2023-10-17 LAB — URINE CULTURE: Result:: NO GROWTH

## 2023-10-17 LAB — CYCLIC CITRUL PEPTIDE ANTIBODY, IGG: Cyclic Citrullin Peptide Ab: <16 U

## 2023-10-17 LAB — HIV ANTIBODY (ROUTINE TESTING W REFLEX): HIV 1&2 Ab, 4th Generation: NONREACTIVE

## 2023-10-17 LAB — RPR: RPR Ser Ql: NONREACTIVE

## 2023-10-17 LAB — RHEUMATOID FACTOR: Rheumatoid fact SerPl-aCnc: <10 [IU]/mL (ref <14)

## 2023-10-28 ENCOUNTER — Encounter: Payer: Self-pay | Admitting: Orthopaedic Surgery

## 2023-10-28 ENCOUNTER — Ambulatory Visit: Admitting: Orthopaedic Surgery

## 2023-10-28 DIAGNOSIS — M25512 Pain in left shoulder: Secondary | ICD-10-CM

## 2023-10-28 DIAGNOSIS — M25511 Pain in right shoulder: Secondary | ICD-10-CM | POA: Diagnosis not present

## 2023-10-28 DIAGNOSIS — G8929 Other chronic pain: Secondary | ICD-10-CM | POA: Diagnosis not present

## 2023-10-28 MED ORDER — NAPROXEN 500 MG PO TABS: 500.0000 mg | ORAL_TABLET | Freq: Two times a day (BID) | ORAL | 3 refills | Status: DC

## 2023-10-28 NOTE — Progress Notes (Signed)
 Office Visit Note   Patient: Ryan Wilson           Date of Birth: 1960-09-10            MRN: [id number] Visit Date: 10/28/2023              Requested by: Elenore Paddy, NP [address] [place],  [place] [address] PCP: Elenore Paddy, NP   Assessment & Plan: Visit Diagnoses:  1. Chronic pain of both shoulders     Plan: Patient is a 63 year old gentleman with bilateral shoulder pain.  In regards to right shoulder impression is rotator cuff tendinitis.  In regards to the left shoulder impression is scapular trigger points.  Recommend physical therapy for both shoulders for 6 weeks and Naprosyn as needed.  Follow-up if symptoms do not improve.  Follow-Up Instructions: No follow-ups on file.   Orders:  Orders Placed This Encounter  Procedures   Ambulatory referral to Physical Therapy   Meds ordered this encounter  Medications   naproxen (NAPROSYN) 500 MG tablet    Sig: Take 1 tablet (500 mg total) by mouth 2 (two) times daily with a meal.    Dispense:  30 tablet    Refill:  3      Procedures: No procedures performed   Clinical Data: No additional findings.   Subjective: Chief Complaint  Patient presents with   Right Shoulder - Pain   Left Shoulder - Pain    HPI Patient is a 63 year old gentleman here for evaluation of bilateral shoulder pain.  Reports left shoulder blade pain and right lateral deltoid pain.  Denies any radicular symptoms. Review of Systems   Objective: Vital Signs: There were no vitals taken for this visit.  Physical Exam  Ortho Exam Exam of the right shoulder shows excellent range of motion with mild pain.  Manual muscle testing the rotator cuff is normal with mild pain.  Exam of the left shoulder shows excellent range of motion with mild pain.  Manual muscle testing of rotator cuff is normal. Specialty Comments:  No specialty comments available.  Imaging: No results found.   PMFS History: Patient Active Problem List   Diagnosis  Date Noted   SVT (supraventricular tachycardia) (HCC) 10/15/2023   Chronic pain of both shoulders 10/15/2023   Abdominal pain 10/15/2023   Abnormal urine odor 10/15/2023   Hyperlipidemia 10/15/2023   Duodenal ulcer, unspecified as acute or chronic, without hemorrhage or perforation 03/14/2022   Gastroduodenitis 03/14/2022   Helicobacter pylori (H. pylori) as the cause of diseases classified elsewhere 03/14/2022   Periumbilical pain 03/14/2022   History of colonic polyps 03/14/2022   Right knee pain 02/17/2019   Lower back pain 02/17/2019   Health care maintenance 06/18/2018   Gastroesophageal reflux disease 06/18/2018   Erectile disorder due to medical condition in male 06/18/2018   Alcohol use 06/18/2018   Prostate cancer (HCC) 07/18/2017   Past Medical History:  Diagnosis Date   Back pain    Heart murmur    PER PATIENT WAS HEARD BY A NURSE; DENIES SYMPTOMS     Inguinal hernia    REPAIRED  IN 1990s   Prostate cancer (HCC)     Family History  Problem Relation Age of Onset   Prostate cancer Brother     Past Surgical History:  Procedure Laterality Date   HERNIA REPAIR  1995, 1996   INGUINAL HERNIA    PELVIC LYMPH NODE DISSECTION Bilateral 07/18/2017   Procedure: LIMITED PELVIC LYMPH  NODE DISSECTION;  Surgeon: Sebastian Ache, MD;  Location: [facility];  Service: Urology;  Laterality: Bilateral;   PROSTATE BIOPSY     ROBOT ASSISTED LAPAROSCOPIC RADICAL PROSTATECTOMY N/A 07/18/2017   Procedure: XI ROBOTIC ASSISTED LAPAROSCOPIC RADICAL PROSTATECTOMY, RIGHT INGUINAL HERNIA REPAIR;  Surgeon: Sebastian Ache, MD;  Location: [facility];  Service: Urology;  Laterality: N/A;   Social History   Occupational History   Not on file  Tobacco Use   Smoking status: Former    Current packs/day: 0.00    Average packs/day: 0.5 packs/day for 4.0 years (2.0 ttl pk-yrs)    Types: Cigarettes    Start date: 07/16/1998    Quit date: 07/16/2002    Years since quitting: 21.2   Smokeless tobacco:  Never  Vaping Use   Vaping status: Never Used  Substance and Sexual Activity   Alcohol use: Yes    Alcohol/week: 6.0 standard drinks of alcohol    Types: 2 Shots of liquor, 4 Cans of beer per week    Comment: 12oz x2 of beer per day   Drug use: No   Sexual activity: Not Currently

## 2023-10-29 ENCOUNTER — Encounter: Payer: Self-pay | Admitting: Internal Medicine

## 2023-10-29 ENCOUNTER — Ambulatory Visit: Attending: Internal Medicine | Admitting: Internal Medicine

## 2023-10-29 VITALS — BP 110/72 | HR 101 | Resp 16 | Ht 70.0 in | Wt 182.6 lb

## 2023-10-29 DIAGNOSIS — I471 Supraventricular tachycardia, unspecified: Secondary | ICD-10-CM

## 2023-10-29 DIAGNOSIS — I483 Typical atrial flutter: Secondary | ICD-10-CM | POA: Diagnosis not present

## 2023-10-29 MED ORDER — ASPIRIN 81 MG PO TBEC: 81.0000 mg | DELAYED_RELEASE_TABLET | Freq: Every day | ORAL | Status: DC

## 2023-10-29 MED ORDER — METOPROLOL SUCCINATE ER 25 MG PO TB24: 25.0000 mg | ORAL_TABLET | Freq: Every day | ORAL | 3 refills | Status: DC

## 2023-10-29 NOTE — Progress Notes (Signed)
 Cardiology Office Note:   Date:  10/29/2023  ID:  Ryan Wilson, DOB 1960-08-19, MRN [id number] PCP:  Elenore Paddy, NP  [facility] Providers Cardiologist:  Alverda Skeans, MD Referring MD: Elenore Paddy, NP  Chief Complaint/Reason for Referral: Follow-up for palpitations ASSESSMENT:    1. SVT (supraventricular tachycardia) (HCC)   2. Typical atrial flutter (HCC)     PLAN:   In order of problems listed above: SVT: Pretty reassuring monitor done in 2023 with very short runs of SVT. Atrial flutter: Seen on EKG today.  CV2 score 0 so no need for anticoagulation.  Will start aspirin 81 mg and check echocardiogram.  Restart Toprol 25 mg daily.  Will follow-up monitor results.  Will refer to EP for further recommendations.            Dispo:  Return in about 6 months (around 04/29/2024).      Medication Adjustments/Labs and Tests Ordered: Current medicines are reviewed at length with the patient today.  Concerns regarding medicines are outlined above.  The following changes have been made:     Labs/tests ordered: Orders Placed This Encounter  Procedures   Ambulatory referral to Cardiac Electrophysiology   EKG 12-Lead   ECHOCARDIOGRAM COMPLETE    Medication Changes: Meds ordered this encounter  Medications   aspirin EC 81 MG tablet    Sig: Take 1 tablet (81 mg total) by mouth daily. Swallow whole.   metoprolol succinate (TOPROL XL) 25 MG 24 hr tablet    Sig: Take 1 tablet (25 mg total) by mouth at bedtime.    Dispense:  90 tablet    Refill:  3    Current medicines are reviewed at length with the patient today.  The patient does not have concerns regarding medicines.  I spent 33 minutes reviewing all clinical data during and prior to this visit including all relevant imaging studies, laboratories, clinical information from other health systems and prior notes from both Cardiology and other specialties, interviewing the patient, conducting a complete physical examination,  and coordinating care in order to formulate a comprehensive and personalized evaluation and treatment plan.   History of Present Illness:      FOCUSED PROBLEM LIST:   Alcohol use Palpitations Short runs of SVT, rare PACs, and PVCs monitor 2023 Atypical chest pain syndrome No CAD, CAC 0 coronary CTA 2024 Prediabetes Atrial flutter Seen on EKG October 29, 2023 CV 2 score of 0  July 2023:  The patient is a 63 y.o. male with the indicated medical history here for for emergency room follow-up for palpitations.  The patient was seen in the emergency department recently for palpitations.  He started a few days ago.  In the emergency department he was hemodynamically stable with a reassuring evaluation including normal cardiac biomarkers, reassuring EKG, chest x-ray, and other laboratories.  He was discharged home.   The patient tells me maybe once or twice a week he will get palpitations.  They are associated with a little bit of lightheadedness and shortness of breath but no chest pain.  He denies any exertional angina.  He has had no peripheral edema, paroxysmal nocturnal dyspnea, or orthopnea.  He does not smoke but he does drink especially on the weekends.  He does not think that he experiences palpitations on the weekends however.  He denies any severe bleeding or bruising episodes, signs or symptoms of stroke, or need for hospitalization.  Plan: Refer for monitor and echocardiogram.  April 2025:  Patient consents  to use of AI scribe. The patient returns for routine follow-up.  He was seen last March and reported atypical chest pain.  He had also been out of his metoprolol for several months.  Due to chest pain he is referred for coronary CTA which was reassuring.  He was seen by his primary care provider recently.  He was noted not to have been taking metoprolol.  He was referred for monitor.  He acknowledges issues with medication adherence, specifically with metoprolol, which he stopped taking  because he felt he was getting better. This non-compliance poses a risk given the presence of atrial flutter.  He describes intermittent chest discomfort, noting that it sometimes feels 'funny' and can move from one side to the other. No chest pain while walking and reports that his breathing is generally good.  He consumes two or three beers during the week and more on weekends. He wants to stop drinking to improve his health.  Occasional lightheadedness but no blacking out spells.  Develops palpitations may be every few days.  Can last about an hour at the longest.         Current Medications: Current Meds  Medication Sig   aspirin EC 81 MG tablet Take 1 tablet (81 mg total) by mouth daily. Swallow whole.     Review of Systems:   Please see the history of present illness.    All other systems reviewed and are negative.     EKGs/Labs/Other Test Reviewed:   EKG: EKG from March 2024 demonstrates sinus rhythm with sinus arrhythmia  EKG Interpretation Date/Time:  Wednesday October 29 2023 16:55:02 EDT Ventricular Rate:  101 PR Interval:  184 QRS Duration:  84 QT Interval:  266 QTC Calculation: 344 R Axis:   48  Text Interpretation: Atrial flutter convering to sinus rhythm ST & T wave abnormality, consider inferior ischemia When compared with ECG of 10-Feb-2022 09:46, Non-specific change in ST segment in Lateral leads T wave inversion now evident in Inferior leads Nonspecific T wave abnormality now evident in Anterolateral leads QT has shortened Confirmed by Alverda Skeans ([id number]) on 10/29/2023 4:59:46 PM         Risk Assessment/Calculations:    CHA2DS2-VASc Score = 0   This indicates a 0.2% annual risk of stroke. The patient's score is based upon: CHF History: 0 HTN History: 0 Diabetes History: 0 Stroke History: 0 Vascular Disease History: 0 Age Score: 0 Gender Score: 0          Physical Exam:   VS:  BP 110/72 (BP Location: Left Arm, Patient Position: Sitting, Cuff  Size: Large)   Pulse (!) 101   Resp 16   Ht 5\' 10"  (1.778 m)   Wt 182 lb 9.6 oz (82.8 kg)   SpO2 98%   BMI 26.20 kg/m        Wt Readings from Last 3 Encounters:  10/29/23 182 lb 9.6 oz (82.8 kg)  10/15/23 182 lb 4 oz (82.7 kg)  10/01/22 179 lb 3.2 oz (81.3 kg)      GENERAL:  No apparent distress, AOx3 HEENT:  No carotid bruits, +2 carotid impulses, no scleral icterus CAR: RRR no murmurs, gallops, rubs, or thrills RES:  Clear to auscultation bilaterally ABD:  Soft, nontender, nondistended, positive bowel sounds x 4 VASC:  +2 radial pulses, +2 carotid pulses NEURO:  CN 2-12 grossly intact; motor and sensory grossly intact PSYCH:  No active depression or anxiety EXT:  No edema, ecchymosis, or cyanosis  Signed, Consuella Lose  Dot Lanes, MD  10/29/2023 7:30 PM    [facility] [address] [place], [place], [place]  [address] Phone: [phone]; Fax: [phone]   Note:  This document was prepared using Dragon voice recognition software and may include unintentional dictation errors.

## 2023-10-29 NOTE — Patient Instructions (Signed)
 Medication Instructions:  Your physician has recommended you make the following change in your medication:  1.) start aspirin 81 mg - one tablet daily  *If you need a refill on your cardiac medications before your next appointment, please call your pharmacy*  Lab Work: none  Testing/Procedures: Your physician has requested that you have an echocardiogram. Echocardiography is a painless test that uses sound waves to create images of your heart. It provides your doctor with information about the size and shape of your heart and how well your heart's chambers and valves are working. This procedure takes approximately one hour. There are no restrictions for this procedure. Please do NOT wear cologne, perfume, aftershave, or lotions (deodorant is allowed). Please arrive 15 minutes prior to your appointment time.  Please note: We ask at that you not bring children with you during ultrasound (echo/ vascular) testing. Due to room size and safety concerns, children are not allowed in the ultrasound rooms during exams. Our front office staff cannot provide observation of children in our lobby area while testing is being conducted. An adult accompanying a patient to their appointment will only be allowed in the ultrasound room at the discretion of the ultrasound technician under special circumstances. We apologize for any inconvenience.   Follow-Up: At [facility], you and your health needs are our priority.  As part of our continuing mission to provide you with exceptional heart care, our providers are all part of one team.  This team includes your primary Cardiologist (physician) and Advanced Practice Providers or APPs (Physician Assistants and Nurse Practitioners) who all work together to provide you with the care you need, when you need it.  Your next appointment:   6 month(s)  Provider:   Tereso Newcomer, PA-C        We recommend signing up for the patient portal called "MyChart".  Sign up  information is provided on this After Visit Summary.  MyChart is used to connect with patients for Virtual Visits (Telemedicine).  Patients are able to view lab/test results, encounter notes, upcoming appointments, etc.  Non-urgent messages can be sent to your provider as well.   To learn more about what you can do with MyChart, go to [email or web address].   Other Instructions You have been referred to cardiac electrophysiology (EP) Dr. Jimmey Ralph your heart rhythm management       1st Floor: - Lobby - Registration  - Pharmacy  - Lab - Cafe  2nd Floor: - PV Lab - Diagnostic Testing (echo, CT, nuclear med)  3rd Floor: - Vacant  4th Floor: - TCTS (cardiothoracic surgery) - AFib Clinic - Structural Heart Clinic - Vascular Surgery  - Vascular Ultrasound  5th Floor: - HeartCare Cardiology (general and EP) - Clinical Pharmacy for coumadin, hypertension, lipid, weight-loss medications, and med management appointments    Valet parking services will be available as well.

## 2023-11-05 DIAGNOSIS — I471 Supraventricular tachycardia, unspecified: Secondary | ICD-10-CM | POA: Diagnosis not present

## 2023-11-06 ENCOUNTER — Encounter (HOSPITAL_COMMUNITY): Payer: Self-pay | Admitting: Internal Medicine

## 2023-11-06 DIAGNOSIS — I471 Supraventricular tachycardia, unspecified: Secondary | ICD-10-CM | POA: Diagnosis not present

## 2023-11-11 ENCOUNTER — Ambulatory Visit: Attending: Cardiology | Admitting: Cardiology

## 2023-11-11 NOTE — Progress Notes (Deleted)
  Electrophysiology Office Note:   Date:  11/11/2023  ID:  Ryan Wilson, DOB 07-27-1961, MRN [id number]  Primary Cardiologist: Kyra Phy, MD Electrophysiologist: Ardeen Kohler, MD  {Click to update primary MD,subspecialty MD or APP then REFRESH:1}    History of Present Illness:   Ryan Wilson is a 63 y.o. male with h/o SVT and typical atrial flutter who is being seen today for EP evaluation at the request of Dr. Lorie Rook.  Discussed the use of AI scribe software for clinical note transcription with the patient, who gave verbal consent to proceed.  History of Present Illness    Review of systems complete and found to be negative unless listed in HPI.   EP Information / Studies Reviewed:    {EKGtoday:28818}     EKG 10/29/23: AFL -> sinus    Zio Monitor 10/2023:  13 day monitor   Sinus rhythm HR 54 - 122, average 81 bpm. 42 nonsustained SVT (longest 6 beats) Atrial fibrillation detected. -- some episodes may be flutter. Longest episode was 10 minutes; HR range 62-167 bpm Occasional supraventricular ectopy, 2.2%. Rare ventricular ectopy. Symptom trigger episodes correspond to normal sinus rhythm   Coronary CTA 10/08/22:  IMPRESSION: 1. Coronary calcium score of 0. This was 0 percentile for age-, sex, and race-matched controls.   2. Normal coronary origin with right dominance.   3. No evidence of CAD.   RECOMMENDATIONS: CAD-RADS 0: No evidence of CAD (0%). Consider non-atherosclerotic causes of chest pain.  Risk Assessment/Calculations:    CHA2DS2-VASc Score = 0  {Confirm score is correct.  If not, click here to update score.  REFRESH note.  :1} This indicates a 0.2% annual risk of stroke. The patient's score is based upon: CHF History: 0 HTN History: 0 Diabetes History: 0 Stroke History: 0 Vascular Disease History: 0 Age Score: 0 Gender Score: 0     No BP recorded.  {Refresh Note OR Click here to enter BP  :1}***        Physical Exam:   VS:  There  were no vitals taken for this visit.   Wt Readings from Last 3 Encounters:  10/29/23 182 lb 9.6 oz (82.8 kg)  10/15/23 182 lb 4 oz (82.7 kg)  10/01/22 179 lb 3.2 oz (81.3 kg)     GEN: Well nourished, well developed in no acute distress NECK: No JVD CARDIAC: {EPRHYTHM:28826}, no murmurs, rubs, gallops RESPIRATORY:  Clear to auscultation without rales, wheezing or rhonchi  ABDOMEN: Soft, non-distended EXTREMITIES:  No edema; No deformity   ASSESSMENT AND PLAN:    #. Paroxysmal atrial flutter/fibrillation, symptomatic:  #. SVT?: Chart diagnosis. Short bursts on Zio. Could potentially have been pulm vein AT.   #. Hypercoagulable state due to atrial flutter: CHADSVASC score of 0.     Follow up with {UUVOZ:36644} {EPFOLLOW IH:47425}  Signed, Ardeen Kohler, MD

## 2023-11-12 ENCOUNTER — Encounter: Payer: Self-pay | Admitting: Cardiology

## 2023-11-18 ENCOUNTER — Ambulatory Visit: Admitting: Physical Therapy

## 2023-11-19 ENCOUNTER — Telehealth (HOSPITAL_COMMUNITY): Payer: Self-pay | Admitting: Internal Medicine

## 2023-11-19 NOTE — Telephone Encounter (Signed)
 Just an FYI. We have made several attempts to contact this patient including sending a letter to schedule or reschedule their echocardiogram. We will be removing the patient from the echo WQ.  11/06/23 MAILED LETTER AND COPIED MD  11/06/23 [facility] to schedule x 3 @ 9:48/LBW  11/04/23 [facility] to schedule @ 9 am LBW  10/30/23 [facility] to schedule @ 12:02/LBW       Thank you

## 2023-11-20 ENCOUNTER — Encounter: Payer: Self-pay | Admitting: Internal Medicine

## 2023-11-20 ENCOUNTER — Ambulatory Visit: Attending: Internal Medicine | Admitting: Internal Medicine

## 2023-11-20 VITALS — BP 142/78 | HR 69 | Ht 71.0 in | Wt 183.0 lb

## 2023-11-20 DIAGNOSIS — I471 Supraventricular tachycardia, unspecified: Secondary | ICD-10-CM

## 2023-11-20 NOTE — Patient Instructions (Signed)
 Medication Instructions:  Your physician recommends that you continue on your current medications as directed. Please refer to the Current Medication list given to you today.  *If you need a refill on your cardiac medications before your next appointment, please call your pharmacy*  Lab Work: Within 30 days of the procedure.  You may go to any [facility] Location for your lab work:  [facility] - [address] ([facility]) - [address] [phone] [address]  [place] - [address]   [place]  - [address]   [place] - [address] - [address] [employer]  - [address] ([facility]   If you have labs (blood work) drawn today and your tests are completely normal, you will receive your results only by: [employer] (if you have MyChart)  If you have any lab test that is abnormal or we need to change your treatment, we will call you or send a MyChart message to review the results.  Testing/Procedures: Ablation to be scheduled August 19th, 2025.  Follow-Up: At [facility], you and your health needs are our priority.  As part of our continuing mission to provide you with exceptional heart care, we have created designated Provider Care Teams.  These Care Teams include your primary Cardiologist (physician) and Advanced Practice Providers (APPs -  Physician Assistants and Nurse Practitioners) who all work together to provide you with the care you need, when you need it.  We recommend signing up for the patient portal called "MyChart".  Sign up information is provided on this After Visit Summary.  MyChart is used to connect with patients for Virtual Visits (Telemedicine).  Patients are able to view lab/test results, encounter notes, upcoming appointments, etc.  Non-urgent messages can be sent to your provider as well.   To learn more about  what you can do with MyChart, go to [email or web address].    Your next appointment:   To be scheduled  The format for your next appointment:   In Person  Provider:   Manya Sells, MD{or one of the following Advanced Practice Providers on your designated Care Team:   Mertha Abrahams, [place] Bambi Lever "Jonelle Neri" [place], [place] Neda Balk, NP  Note: Remote monitoring is used to monitor your Pacemaker/ ICD from home. This monitoring reduces the number of office visits required to check your device to one time per year. It allows us  to keep an eye on the functioning of your device to ensure it is working properly.            Valet parking services will be available as well.

## 2023-11-20 NOTE — Progress Notes (Signed)
 HPI Ryan Wilson is referred by Dr. Oneta Bilberry for evaluation of atrial flutter and SVT. He is a pleasant 63 yo man from [facility] who moved to [place] to be closer to family. He wore a cardiac monitor which demonstrated atrial flutter and NS SVT. He has a CHADSVASC score of 0. He feels like his heart races some most days. He has known typical flutter on 12 lead ECG. Allergies  Allergen Reactions   No Known Allergies      Current Outpatient Medications  Medication Sig Dispense Refill   aspirin  EC 81 MG tablet Take 1 tablet (81 mg total) by mouth daily. Swallow whole.     metoprolol  succinate (TOPROL  XL) 25 MG 24 hr tablet Take 1 tablet (25 mg total) by mouth at bedtime. 90 tablet 3   naproxen  (NAPROSYN ) 500 MG tablet Take 1 tablet (500 mg total) by mouth 2 (two) times daily with a meal. 30 tablet 3   No current facility-administered medications for this visit.     Past Medical History:  Diagnosis Date   Back pain    Heart murmur    PER PATIENT WAS HEARD BY A NURSE; DENIES SYMPTOMS     Inguinal hernia    REPAIRED  IN 1990s   Prostate cancer (HCC)     ROS:   All systems reviewed and negative except as noted in the HPI.   Past Surgical History:  Procedure Laterality Date   HERNIA REPAIR  1995, 1996   INGUINAL HERNIA    PELVIC LYMPH NODE DISSECTION Bilateral 07/18/2017   Procedure: LIMITED PELVIC LYMPH NODE DISSECTION;  Surgeon: Osborn Blaze, MD;  Location: [facility];  Service: Urology;  Laterality: Bilateral;   PROSTATE BIOPSY     ROBOT ASSISTED LAPAROSCOPIC RADICAL PROSTATECTOMY N/A 07/18/2017   Procedure: XI ROBOTIC ASSISTED LAPAROSCOPIC RADICAL PROSTATECTOMY, RIGHT INGUINAL HERNIA REPAIR;  Surgeon: Osborn Blaze, MD;  Location: [facility];  Service: Urology;  Laterality: N/A;     Family History  Problem Relation Age of Onset   Prostate cancer Brother      Social History   Socioeconomic History   Marital status: Married    Spouse name: Not on file   Number of children:  Not on file   Years of education: Not on file   Highest education level: Not on file  Occupational History   Not on file  Tobacco Use   Smoking status: Former    Current packs/day: 0.00    Average packs/day: 0.5 packs/day for 4.0 years (2.0 ttl pk-yrs)    Types: Cigarettes    Start date: 07/16/1998    Quit date: 07/16/2002    Years since quitting: 21.3   Smokeless tobacco: Never  Vaping Use   Vaping status: Never Used  Substance and Sexual Activity   Alcohol use: Yes    Alcohol/week: 6.0 standard drinks of alcohol    Types: 2 Shots of liquor, 4 Cans of beer per week    Comment: 12oz x2 of beer per day   Drug use: No   Sexual activity: Not Currently  Other Topics Concern   Not on file  Social History Narrative   05-29-18 Unable to ask abuse questions today wife with him.   Social Drivers of Health   Financial Resource Strain: Low Risk  (04/21/2021)   Overall Financial Resource Strain (CARDIA)    Difficulty of Paying Living Expenses: Not hard at all  Food Insecurity: No Food Insecurity (04/21/2021)   Hunger Vital Sign  Worried About Programme researcher, broadcasting/film/video in the Last Year: Never true    Ran Out of Food in the Last Year: Never true  Transportation Needs: No Transportation Needs (04/21/2021)   PRAPARE - Administrator, Civil Service (Medical): No    Lack of Transportation (Non-Medical): No  Physical Activity: Sufficiently Active (04/21/2021)   Exercise Vital Sign    Days of Exercise per Week: 7 days    Minutes of Exercise per Session: 60 min  Stress: No Stress Concern Present (04/21/2021)   [employer] of Occupational Health - Occupational Stress Questionnaire    Feeling of Stress : Not at all  Social Connections: Moderately Isolated (04/21/2021)   Social Connection and Isolation Panel [NHANES]    Frequency of Communication with Friends and Family: More than three times a week    Frequency of Social Gatherings with Friends and Family: Three times a week     Attends Religious Services: 1 to 4 times per year    Active Member of Clubs or Organizations: No    Attends Banker Meetings: Never    Marital Status: Separated  Intimate Partner Violence: Not At Risk (04/21/2021)   Humiliation, Afraid, Rape, and Kick questionnaire    Fear of Current or Ex-Partner: No    Emotionally Abused: No    Physically Abused: No    Sexually Abused: No     BP (!) 142/78   Pulse 69   Ht 5\' 11"  (1.803 m)   Wt 183 lb (83 kg)   SpO2 94%   BMI 25.52 kg/m   Physical Exam:  Well appearing 63 yo man, NAD HEENT: Unremarkable Neck:  No JVD, no thyromegally Lymphatics:  No adenopathy Back:  No CVA tenderness Lungs:  Clear with no wheezes HEART:  Regular rate rhythm, no murmurs, no rubs, no clicks Abd:  soft, positive bowel sounds, no organomegally, no rebound, no guarding Ext:  2 plus pulses, no edema, no cyanosis, no clubbing Skin:  No rashes no nodules Neuro:  CN II through XII intact, motor grossly intact  EKG - nsr   Assess/Plan: Atrial flutter - I have discussed the treatment options with the patient and recommend EP study and ablation. Because he has some AT/SVT, I will not use general anesthesia  Donnald Fuss

## 2023-11-28 ENCOUNTER — Ambulatory Visit

## 2024-01-02 ENCOUNTER — Telehealth: Payer: Self-pay | Admitting: Internal Medicine

## 2024-01-02 DIAGNOSIS — I471 Supraventricular tachycardia, unspecified: Secondary | ICD-10-CM

## 2024-01-02 DIAGNOSIS — I479 Paroxysmal tachycardia, unspecified: Secondary | ICD-10-CM | POA: Diagnosis not present

## 2024-01-02 NOTE — Telephone Encounter (Signed)
 Patient walked in wanting to schedule a procedure (Stent) but I did not see any notes regarding that. Patient sent to 5th floor to speak to triage.

## 2024-01-02 NOTE — Telephone Encounter (Signed)
 Pt called back in about this note. Dr. Carolynne Citron told him at last appt he is supposed to have an ablation in August. He asked if this has been set up yet.

## 2024-01-02 NOTE — Telephone Encounter (Signed)
 Pt made aware that from what I can see in his chart, he is to be scheduled for an ablation 8/19 with Dr. Carolynne Citron.  Aware will forward to his nurse to follow up on this next week when she returns, and call it in to the hospital.  Added pt to our EP procedure calendar

## 2024-01-05 DIAGNOSIS — R1033 Periumbilical pain: Secondary | ICD-10-CM | POA: Diagnosis not present

## 2024-01-05 DIAGNOSIS — I499 Cardiac arrhythmia, unspecified: Secondary | ICD-10-CM | POA: Diagnosis not present

## 2024-01-05 DIAGNOSIS — K2970 Gastritis, unspecified, without bleeding: Secondary | ICD-10-CM | POA: Diagnosis not present

## 2024-01-07 NOTE — Addendum Note (Signed)
 Addended by: Dyquan Minks on: 01/07/2024 10:10 AM   Modules accepted: Orders

## 2024-01-07 NOTE — Telephone Encounter (Signed)
 Spoke with pt and he is scheduled for SVT Ablation with Dr. Carolynne Citron on 8/19 at 7:30 AM.   Spoke with patient to complete pre-procedure call.     New medical conditions?  No Recent hospitalizations or surgeries? No Started any new medications? Omeprazole  Patient made aware to contact office to inform of any new medications started. Any changes in activities of daily living? No  Pre-procedure testing scheduled: lab work on 8/5 Pt aware to HOLD metoprolol  for 3 days prior to ablation. Last dose will be on Friday, August 15.  Confirmed patient is scheduled for SVT Ablation  on Tuesday, August 19 with Dr. Manya Sells. Instructed patient to arrive at the Main Entrance A at [facility]: [address] [place], [place] [address] and check in at Admitting at 5:30 AM  Advised of plan to go home the same day and will only stay overnight if medically necessary. You MUST have a responsible adult to drive you home and MUST be with you the first 24 hours after you arrive home or your procedure could be cancelled.  Patient verbalized understanding to information provided and is agreeable to proceed with procedure.

## 2024-01-21 ENCOUNTER — Ambulatory Visit

## 2024-01-21 VITALS — Ht 71.0 in | Wt 183.0 lb

## 2024-01-21 DIAGNOSIS — Z1159 Encounter for screening for other viral diseases: Secondary | ICD-10-CM | POA: Diagnosis not present

## 2024-01-21 DIAGNOSIS — Z0000 Encounter for general adult medical examination without abnormal findings: Secondary | ICD-10-CM

## 2024-01-21 NOTE — Progress Notes (Signed)
 Subjective:  Please attest and cosign this visit due to patients primary care provider not being in the office at the time the visit was completed.  (Pt of Lauraine Pereyra, NP)   Ryan Wilson is a 63 y.o. who presents for a Medicare Wellness preventive visit.  As a reminder, Annual Wellness Visits don't include a physical exam, and some assessments may be limited, especially if this visit is performed virtually. We may recommend an in-person follow-up visit with your provider if needed.  Visit Complete: Virtual I connected with  Ryan Wilson on 01/21/24 by a audio enabled telemedicine application and verified that I am speaking with the correct person using two identifiers.  Patient Location: Home  Provider Location: Office/Clinic  I discussed the limitations of evaluation and management by telemedicine. The patient expressed understanding and agreed to proceed.  Vital Signs: Because this visit was a virtual/telehealth visit, some criteria may be missing or patient reported. Any vitals not documented were not able to be obtained and vitals that have been documented are patient reported.  VideoDeclined- This patient declined Librarian, academic. Therefore the visit was completed with audio only.  Persons Participating in Visit: Patient.  AWV Questionnaire: No: Patient Medicare AWV questionnaire was not completed prior to this visit.  Cardiac Risk Factors include: advanced age (>59men, >29 women);dyslipidemia;male gender     Objective:    Today's Vitals   01/21/24 1516  Weight: 183 lb (83 kg)  Height: 5' 11 (1.803 m)   Body mass index is 25.52 kg/m.     01/21/2024    3:15 PM 04/21/2021   12:39 PM 02/05/2021    4:09 PM 02/17/2019    2:43 PM 06/17/2018    3:22 PM 05/29/2018   10:39 AM 02/12/2018    7:42 AM  Advanced Directives  Does Patient Have a Medical Advance Directive? No No No No No  No  No   Would patient like information on creating a medical  advance directive? No - Patient declined No - Patient declined No - Patient declined No - Patient declined  No - Patient declined   No - Patient declined      Data saved with a previous flowsheet row definition    Current Medications (verified) Outpatient Encounter Medications as of 01/21/2024  Medication Sig   aspirin  EC 81 MG tablet Take 1 tablet (81 mg total) by mouth daily. Swallow whole.   metoprolol  succinate (TOPROL  XL) 25 MG 24 hr tablet Take 1 tablet (25 mg total) by mouth at bedtime.   naproxen  (NAPROSYN ) 500 MG tablet Take 1 tablet (500 mg total) by mouth 2 (two) times daily with a meal.   No facility-administered encounter medications on file as of 01/21/2024.    Allergies (verified) No known allergies   History: Past Medical History:  Diagnosis Date   Back pain    Heart murmur    PER PATIENT WAS HEARD BY A NURSE; DENIES SYMPTOMS     Inguinal hernia    REPAIRED  IN 1990s   Prostate cancer [facility])    Past Surgical History:  Procedure Laterality Date   HERNIA REPAIR  1995, 1996   INGUINAL HERNIA    PELVIC LYMPH NODE DISSECTION Bilateral 07/18/2017   Procedure: LIMITED PELVIC LYMPH NODE DISSECTION;  Surgeon: Alvaro Hummer, MD;  Location: [facility];  Service: Urology;  Laterality: Bilateral;   PROSTATE BIOPSY     ROBOT ASSISTED LAPAROSCOPIC RADICAL PROSTATECTOMY N/A 07/18/2017   Procedure: XI ROBOTIC ASSISTED LAPAROSCOPIC  RADICAL PROSTATECTOMY, RIGHT INGUINAL HERNIA REPAIR;  Surgeon: Alvaro Hummer, MD;  Location: [facility];  Service: Urology;  Laterality: N/A;   Family History  Problem Relation Age of Onset   Prostate cancer Brother    Social History   Socioeconomic History   Marital status: Married    Spouse name: Not on file   Number of children: Not on file   Years of education: Not on file   Highest education level: Not on file  Occupational History   Not on file  Tobacco Use   Smoking status: Former    Current packs/day: 0.00    Average packs/day: 0.5  packs/day for 4.0 years (2.0 ttl pk-yrs)    Types: Cigarettes    Start date: 07/16/1998    Quit date: 07/16/2002    Years since quitting: 21.5   Smokeless tobacco: Never  Vaping Use   Vaping status: Never Used  Substance and Sexual Activity   Alcohol use: Yes    Alcohol/week: 6.0 standard drinks of alcohol    Types: 2 Shots of liquor, 4 Cans of beer per week    Comment: 12oz x2 of beer per day   Drug use: No   Sexual activity: Yes  Other Topics Concern   Not on file  Social History Narrative   05-29-18 Unable to ask abuse questions today wife with him.   Social Drivers of Corporate investment banker Strain: Low Risk  (01/21/2024)   Overall Financial Resource Strain (CARDIA)    Difficulty of Paying Living Expenses: Not hard at all  Food Insecurity: No Food Insecurity (01/21/2024)   Hunger Vital Sign    Worried About Running Out of Food in the Last Year: Never true    Ran Out of Food in the Last Year: Never true  Transportation Needs: No Transportation Needs (01/21/2024)   PRAPARE - Administrator, Civil Service (Medical): No    Lack of Transportation (Non-Medical): No  Physical Activity: Sufficiently Active (01/21/2024)   Exercise Vital Sign    Days of Exercise per Week: 7 days    Minutes of Exercise per Session: 60 min  Stress: No Stress Concern Present (01/21/2024)   [employer] of Occupational Health - Occupational Stress Questionnaire    Feeling of Stress: Not at all  Social Connections: Moderately Integrated (01/21/2024)   Social Connection and Isolation Panel    Frequency of Communication with Friends and Family: More than three times a week    Frequency of Social Gatherings with Friends and Family: Three times a week    Attends Religious Services: 1 to 4 times per year    Active Member of Clubs or Organizations: No    Attends Banker Meetings: Never    Marital Status: Married    Tobacco Counseling Counseling given: No    Clinical  Intake:  Pre-visit preparation completed: Yes  Pain : No/denies pain     BMI - recorded: 25.52 Nutritional Status: BMI 25 -29 Overweight Nutritional Risks: None Diabetes: No  Lab Results  Component Value Date   HGBA1C 6.1 10/15/2023     How often do you need to have someone help you when you read instructions, pamphlets, or other written materials from your doctor or pharmacy?: 1 - Never  Interpreter Needed?: No  Information entered by :: Verdie Saba, CMA   Activities of Daily Living     01/21/2024    3:18 PM  In your present state of health, do you have any  difficulty performing the following activities:  Hearing? 0  Vision? 0  Difficulty concentrating or making decisions? 0  Walking or climbing stairs? 0  Dressing or bathing? 0  Doing errands, shopping? 0  Preparing Food and eating ? N  Using the Toilet? N  In the past six months, have you accidently leaked urine? N  Do you have problems with loss of bowel control? N  Managing your Medications? N  Managing your Finances? N  Housekeeping or managing your Housekeeping? N    Patient Care Team: Elnor Lauraine BRAVO, NP as PCP - General (Nurse Practitioner) Thukkani, Arun K, MD as PCP - Cardiology (Cardiology) Kennyth Chew, MD as PCP - Electrophysiology (Cardiology)  I have updated your Care Teams any recent Medical Services you may have received from other providers in the past year.     Assessment:   This is a routine wellness examination for Coltrane.  Hearing/Vision screen Hearing Screening - Comments:: Denies hearing difficulties   Vision Screening - Comments:: Wears rx glasses - plans to be seen at [facility]     Goals Addressed               This Visit's Progress     Patient Stated (pt-stated)        Patient stated he's staying active fishing and watching his diet - eat less pork and more greens       Depression Screen     01/21/2024    3:19 PM 10/15/2023    9:34 AM 04/21/2021   12:32 PM  06/17/2018    3:25 PM 02/12/2018    7:46 AM  PHQ 2/9 Scores  PHQ - 2 Score 0 0 0 2 0  PHQ- 9 Score 0   8     Fall Risk     01/21/2024    3:21 PM 10/15/2023    9:34 AM 04/21/2021   12:35 PM 11/30/2020   12:55 PM 02/17/2019    2:41 PM  Fall Risk   Falls in the past year? 0 0 0 0 0   Number falls in past yr: 0 0 0 0 0   Injury with Fall? 0 0 0 0   Risk for fall due to : No Fall Risks No Fall Risks  No Fall Risks   Follow up Falls evaluation completed;Falls prevention discussed Falls evaluation completed  Falls evaluation completed       Data saved with a previous flowsheet row definition    MEDICARE RISK AT HOME:  Medicare Risk at Home Any stairs in or around the home?: No If so, are there any without handrails?: No Home free of loose throw rugs in walkways, pet beds, electrical cords, etc?: Yes Adequate lighting in your home to reduce risk of falls?: Yes Life alert?: No Use of a cane, walker or w/c?: No Grab bars in the bathroom?: No Shower chair or bench in shower?: Yes Elevated toilet seat or a handicapped toilet?: No  TIMED UP AND GO:  Was the test performed?  No  Cognitive Function: 6CIT completed        01/21/2024    3:22 PM 04/21/2021   12:39 PM  6CIT Screen  What Year? 0 points 0 points  What month? 0 points 0 points  What time? 0 points 0 points  Count back from 20 0 points 2 points  Months in reverse 2 points 4 points  Repeat phrase 4 points 10 points  Total Score 6 points 16 points  Immunizations Immunization History  Administered Date(s) Administered   PFIZER(Purple Top)SARS-COV-2 Vaccination 10/14/2019, 11/08/2019   Tdap 07/11/2020    Screening Tests Health Maintenance  Topic Date Due   Hepatitis C Screening  Never done   Zoster Vaccines- Shingrix (1 of 2) Never done   COVID-19 Vaccine (3 - Pfizer risk series) 12/06/2019   INFLUENZA VACCINE  02/27/2024   Medicare Annual Wellness (AWV)  01/20/2025   DTaP/Tdap/Td (2 - Td or Tdap) 07/11/2030    Colonoscopy  03/12/2032   HIV Screening  Completed   Hepatitis B Vaccines  Aged Out   HPV VACCINES  Aged Out   Meningococcal B Vaccine  Aged Out    Health Maintenance  Health Maintenance Due  Topic Date Due   Hepatitis C Screening  Never done   Zoster Vaccines- Shingrix (1 of 2) Never done   COVID-19 Vaccine (3 - Pfizer risk series) 12/06/2019   Health Maintenance Items Addressed:  Labs Ordered: Hepatitis C Screening   Additional Screening:  Vision Screening: Recommended annual ophthalmology exams for early detection of glaucoma and other disorders of the eye. Would you like a referral to an eye doctor? No  Patient plans to have an annual eye exam in 2025.  Dental Screening: Recommended annual dental exams for proper oral hygiene  Community Resource Referral / Chronic Care Management: CRR required this visit?  No   CCM required this visit?  No   Plan:    I have personally reviewed and noted the following in the patient's chart:   Medical and social history Use of alcohol, tobacco or illicit drugs  Current medications and supplements including opioid prescriptions. Patient is not currently taking opioid prescriptions. Functional ability and status Nutritional status Physical activity Advanced directives List of other physicians Hospitalizations, surgeries, and ER visits in previous 12 months Vitals Screenings to include cognitive, depression, and falls Referrals and appointments  In addition, I have reviewed and discussed with patient certain preventive protocols, quality metrics, and best practice recommendations. A written personalized care plan for preventive services as well as general preventive health recommendations were provided to patient.   Verdie CHRISTELLA Saba, CMA   01/21/2024   After Visit Summary: (Declined) Due to this being a telephonic visit, with patients personalized plan was offered to patient but patient Declined AVS at this time   Notes: Nothing  significant to report at this time.

## 2024-01-21 NOTE — Patient Instructions (Signed)
 [name] , Thank you for taking time out of your busy schedule to complete your Annual Wellness Visit with me. I enjoyed our conversation and look forward to speaking with you again next year. I, as well as your care team,  appreciate your ongoing commitment to your health goals. Please review the following plan we discussed and let me know if I can assist you in the future. Your Game plan/ To Do List    Referrals: If you haven't heard from the office you've been referred to, please reach out to them at the phone provided.  Ordered a Hepatitis C Screening (Lab) Follow up Visits: Next Medicare AWV with our clinical staff: 01/21/2025   Have you seen your provider in the last 6 months (3 months if uncontrolled diabetes)? Yes Next Office Visit with your provider: 04/23/2024 at 3:40p  Clinician Recommendations:  Aim for 30 minutes of exercise or brisk walking, 6-8 glasses of water , and 5 servings of fruits and vegetables each day. Educated and advised on getting the Shingles and COVID vaccines in 2025.        This is a list of the screening recommended for you and due dates:  Health Maintenance  Topic Date Due   Hepatitis C Screening  Never done   Zoster (Shingles) Vaccine (1 of 2) Never done   COVID-19 Vaccine (3 - Pfizer risk series) 12/06/2019   Flu Shot  02/27/2024   Medicare Annual Wellness Visit  01/20/2025   DTaP/Tdap/Td vaccine (2 - Td or Tdap) 07/11/2030   Colon Cancer Screening  03/12/2032   HIV Screening  Completed   Hepatitis B Vaccine  Aged Out   HPV Vaccine  Aged Out   Meningitis B Vaccine  Aged Out    Advanced directives: (Declined) Advance directive discussed with you today. Even though you declined this today, please call our office should you change your mind, and we can give you the proper paperwork for you to fill out. Advance Care Planning is important because it:  [x]  Makes sure you receive the medical care that is consistent with your values, goals, and  preferences  [x]  It provides guidance to your family and loved ones and reduces their decisional burden about whether or not they are making the right decisions based on your wishes.  Follow the link provided in your after visit summary or read over the paperwork we have mailed to you to help you started getting your Advance Directives in place. If you need assistance in completing these, please reach out to us  so that we can help you!

## 2024-01-28 ENCOUNTER — Other Ambulatory Visit (INDEPENDENT_AMBULATORY_CARE_PROVIDER_SITE_OTHER)

## 2024-01-28 DIAGNOSIS — Z1159 Encounter for screening for other viral diseases: Secondary | ICD-10-CM | POA: Diagnosis not present

## 2024-01-29 LAB — HEPATITIS C ANTIBODY: Hepatitis C Ab: NONREACTIVE

## 2024-01-31 ENCOUNTER — Ambulatory Visit: Payer: Self-pay | Admitting: Family

## 2024-02-02 DIAGNOSIS — I499 Cardiac arrhythmia, unspecified: Secondary | ICD-10-CM | POA: Diagnosis not present

## 2024-02-02 DIAGNOSIS — R1033 Periumbilical pain: Secondary | ICD-10-CM | POA: Diagnosis not present

## 2024-02-18 LAB — BASIC METABOLIC PANEL WITH GFR
BUN/Creatinine Ratio: 23 (ref 10–24)
BUN: 21 mg/dL (ref 8–27)
CO2: 22 mmol/L (ref 20–29)
Calcium: 9.5 mg/dL (ref 8.6–10.2)
Chloride: 105 mmol/L (ref 96–106)
Creatinine, Ser: 0.92 mg/dL (ref 0.76–1.27)
Glucose: 87 mg/dL (ref 70–99)
Potassium: 4.3 mmol/L (ref 3.5–5.2)
Sodium: 142 mmol/L (ref 134–144)
eGFR: 95 mL/min/1.73 (ref >59)

## 2024-02-18 LAB — CBC
Hematocrit: 48.7 % (ref 37.5–51.0)
Hemoglobin: 16.1 g/dL (ref 13.0–17.7)
MCH: 31.8 pg (ref 26.6–33.0)
MCHC: 33.1 g/dL (ref 31.5–35.7)
MCV: 96 fL (ref 79–97)
Platelets: 224 x10E3/uL (ref 150–450)
RBC: 5.07 x10E6/uL (ref 4.14–5.80)
RDW: 12.6 % (ref 11.6–15.4)
WBC: 5.9 x10E3/uL (ref 3.4–10.8)

## 2024-03-02 ENCOUNTER — Other Ambulatory Visit: Payer: Self-pay | Admitting: Emergency Medicine

## 2024-03-02 DIAGNOSIS — Z01812 Encounter for preprocedural laboratory examination: Secondary | ICD-10-CM

## 2024-03-02 NOTE — Progress Notes (Unsigned)
 [facility] to [facility] for lab work in [employer] for Ablation cbc and bmp ordered.   Pt is having Ablation 8/19 (pt had cbc and bmp on 7/22, but was told to come and get lab work on 8/5)

## 2024-03-03 LAB — CBC
Hematocrit: 50.3 % (ref 37.5–51.0)
Hemoglobin: 16.7 g/dL (ref 13.0–17.7)
MCH: 31 pg (ref 26.6–33.0)
MCHC: 33.2 g/dL (ref 31.5–35.7)
MCV: 94 fL (ref 79–97)
Platelets: 224 x10E3/uL (ref 150–450)
RBC: 5.38 x10E6/uL (ref 4.14–5.80)
RDW: 12.6 % (ref 11.6–15.4)
WBC: 5.8 x10E3/uL (ref 3.4–10.8)

## 2024-03-03 LAB — BASIC METABOLIC PANEL WITH GFR
BUN/Creatinine Ratio: 21 (ref 10–24)
BUN: 19 mg/dL (ref 8–27)
CO2: 21 mmol/L (ref 20–29)
Calcium: 9.6 mg/dL (ref 8.6–10.2)
Chloride: 102 mmol/L (ref 96–106)
Creatinine, Ser: 0.92 mg/dL (ref 0.76–1.27)
Glucose: 108 mg/dL — AB (ref 70–99)
Potassium: 4.1 mmol/L (ref 3.5–5.2)
Sodium: 139 mmol/L (ref 134–144)
eGFR: 95 mL/min/1.73 (ref >59)

## 2024-03-08 ENCOUNTER — Telehealth: Payer: Self-pay | Admitting: Internal Medicine

## 2024-03-08 NOTE — Telephone Encounter (Signed)
 Patient wants to get a printout of the instructions for his upcoming visit and wants to stop by to pick this up.  Patient wants a call back when information is ready for pickup.

## 2024-03-08 NOTE — Telephone Encounter (Signed)
 Left message for patient to callback to clarify what he is needing a printout of.  Phone note states for upcoming visit. Patient has SVT ablation scheduled for 03/16/24 with Dr. Waddell.

## 2024-03-09 ENCOUNTER — Telehealth (HOSPITAL_COMMUNITY): Payer: Self-pay

## 2024-03-09 NOTE — Telephone Encounter (Signed)
 Attempted to reach patient to discuss upcoming procedure, no answer. Left VM for patient to return call.

## 2024-03-09 NOTE — Telephone Encounter (Signed)
 Met with pt out in the lobby and gave him a printout of his Instruction letter. Went over the instructions with him and his Wife.

## 2024-03-15 NOTE — Pre-Procedure Instructions (Signed)
Instructed patient on the following items: Arrival time 0515 Nothing to eat or drink after midnight No meds AM of procedure Responsible person to drive you home and stay with you for 24 hrs     

## 2024-03-16 ENCOUNTER — Ambulatory Visit (HOSPITAL_COMMUNITY)
Admission: RE | Disposition: A | Discharge status: Home / Self Care | Payer: Self-pay | Source: Home / Self Care | Attending: Internal Medicine

## 2024-03-16 ENCOUNTER — Ambulatory Visit (HOSPITAL_COMMUNITY)
Admission: RE | Admit: 2024-03-16 | Discharge: 2024-03-16 | Disposition: A | Discharge status: Home / Self Care | Attending: Internal Medicine | Admitting: Internal Medicine

## 2024-03-16 DIAGNOSIS — I471 Supraventricular tachycardia, unspecified: Secondary | ICD-10-CM | POA: Insufficient documentation

## 2024-03-16 DIAGNOSIS — I4892 Unspecified atrial flutter: Secondary | ICD-10-CM | POA: Diagnosis not present

## 2024-03-16 DIAGNOSIS — I483 Typical atrial flutter: Secondary | ICD-10-CM

## 2024-03-16 HISTORY — PX: SVT ABLATION: EP1225

## 2024-03-16 SURGERY — SVT ABLATION

## 2024-03-16 MED ORDER — MIDAZOLAM HCL 5 MG/5ML IJ SOLN
INTRAMUSCULAR | Status: DC | PRN
  Administered 2024-03-16 (×4): 1 mg via INTRAVENOUS
  Administered 2024-03-16: 2 mg via INTRAVENOUS
  Administered 2024-03-16 (×3): 1 mg via INTRAVENOUS

## 2024-03-16 MED ORDER — SODIUM CHLORIDE 0.9 % IV SOLN: INTRAVENOUS | Status: DC

## 2024-03-16 MED ORDER — HEPARIN SODIUM (PORCINE) 1000 UNIT/ML IJ SOLN
INTRAMUSCULAR | Status: DC | PRN
  Administered 2024-03-16: 1000 [IU] via INTRAVENOUS

## 2024-03-16 MED ORDER — SODIUM CHLORIDE 0.9% FLUSH
3.0000 mL | INTRAVENOUS | Status: DC | PRN
Start: 2024-03-16 — End: 2024-03-16

## 2024-03-16 MED ORDER — MIDAZOLAM HCL 5 MG/5ML IJ SOLN
INTRAMUSCULAR | Status: AC
Start: 2024-03-16 — End: 2024-03-16
  Filled 2024-03-16: qty 5

## 2024-03-16 MED ORDER — FENTANYL CITRATE (PF) 100 MCG/2ML IJ SOLN
INTRAMUSCULAR | Status: AC
  Filled 2024-03-16: qty 2

## 2024-03-16 MED ORDER — ACETAMINOPHEN 325 MG PO TABS: 650.0000 mg | ORAL_TABLET | ORAL | Status: DC | PRN

## 2024-03-16 MED ORDER — ONDANSETRON HCL 4 MG/2ML IJ SOLN: 4.0000 mg | Freq: Four times a day (QID) | INTRAMUSCULAR | Status: DC | PRN

## 2024-03-16 MED ORDER — MIDAZOLAM HCL 2 MG/2ML IJ SOLN
INTRAMUSCULAR | Status: AC
  Filled 2024-03-16: qty 2

## 2024-03-16 MED ORDER — HEPARIN (PORCINE) IN NACL 1000-0.9 UT/500ML-% IV SOLN
INTRAVENOUS | Status: DC | PRN
  Administered 2024-03-16: 500 mL

## 2024-03-16 MED ORDER — BUPIVACAINE HCL (PF) 0.25 % IJ SOLN
INTRAMUSCULAR | Status: AC
  Filled 2024-03-16: qty 30

## 2024-03-16 MED ORDER — HEPARIN SODIUM (PORCINE) 1000 UNIT/ML IJ SOLN
INTRAMUSCULAR | Status: AC
Start: 2024-03-16 — End: 2024-03-16
  Filled 2024-03-16: qty 10

## 2024-03-16 MED ORDER — FENTANYL CITRATE (PF) 100 MCG/2ML IJ SOLN
INTRAMUSCULAR | Status: DC | PRN
  Administered 2024-03-16 (×5): 12.5 ug via INTRAVENOUS
  Administered 2024-03-16: 25 ug via INTRAVENOUS
  Administered 2024-03-16 (×2): 12.5 ug via INTRAVENOUS

## 2024-03-16 SURGICAL SUPPLY — 10 items
CATH JOSEPH QUAD ALLRED 6F REP (CATHETERS) IMPLANT
CATH POLARIS X 2.5/5/2.5 DECAP (CATHETERS) IMPLANT
CATH SMTCH THERMOCOOL SF FJ (CATHETERS) IMPLANT
PACK EP LF (CUSTOM PROCEDURE TRAY) ×2 IMPLANT
PAD DEFIB RADIO PHYSIO CONN (PAD) ×2 IMPLANT
PATCH CARTO3 (PAD) IMPLANT
SHEATH PINNACLE 6F 10CM (SHEATH) IMPLANT
SHEATH PINNACLE 7F 10CM (SHEATH) IMPLANT
SHEATH PINNACLE 8F 10CM (SHEATH) IMPLANT
TUBING SMART ABLATE COOLFLOW (TUBING) IMPLANT

## 2024-03-16 NOTE — Progress Notes (Signed)
 Site area: 3 venous sheaths right femoral Site Prior to Removal:  Level 0 Pressure Applied For: 20 minutes Manual:   yes Patient Status During Pull:  yes Post Pull Site:  Level 0 Post Pull Instructions Given:  yes Post Pull Pulses Present: right pt palpable Dressing Applied:  gauze and tegaderm Bedrest begins @ 1035 Comments:

## 2024-03-16 NOTE — Progress Notes (Signed)
 PATIENT AMBULATED TO BR - BILATERAL GROINS ARE UNREMARKABLE. (LEFT GROIN DRESSING HAS VARY SMALL AMT BLOOD IN OUTER DRESSING.

## 2024-03-16 NOTE — H&P (Signed)
 HPI [name] is referred by Dr. Dede for evaluation of atrial flutter and SVT. He is a pleasant 63 yo man from [facility] who moved to [place] to be closer to family. He wore a cardiac monitor which demonstrated atrial flutter and NS SVT. He has a CHADSVASC score of 0. He feels like his heart races some most days. He has known typical flutter on 12 lead ECG. Allergies      Allergies  Allergen Reactions   No Known Allergies                  Current Outpatient Medications  Medication Sig Dispense Refill   aspirin  EC 81 MG tablet Take 1 tablet (81 mg total) by mouth daily. Swallow whole.       metoprolol  succinate (TOPROL  XL) 25 MG 24 hr tablet Take 1 tablet (25 mg total) by mouth at bedtime. 90 tablet 3   naproxen  (NAPROSYN ) 500 MG tablet Take 1 tablet (500 mg total) by mouth 2 (two) times daily with a meal. 30 tablet 3      No current facility-administered medications for this visit.              Past Medical History:  Diagnosis Date   Back pain     Heart murmur      PER PATIENT WAS HEARD BY A NURSE; DENIES SYMPTOMS     Inguinal hernia      REPAIRED  IN 1990s   Prostate cancer (HCC)            ROS:    All systems reviewed and negative except as noted in the HPI.          Past Surgical History:  Procedure Laterality Date   HERNIA REPAIR   1995, 1996    INGUINAL HERNIA    PELVIC LYMPH NODE DISSECTION Bilateral 07/18/2017    Procedure: LIMITED PELVIC LYMPH NODE DISSECTION;  Surgeon: Ryan Hummer, MD;  Location: [facility];  Service: Urology;  Laterality: Bilateral;   PROSTATE BIOPSY       ROBOT ASSISTED LAPAROSCOPIC RADICAL PROSTATECTOMY N/A 07/18/2017    Procedure: XI ROBOTIC ASSISTED LAPAROSCOPIC RADICAL PROSTATECTOMY, RIGHT INGUINAL HERNIA REPAIR;  Surgeon: Ryan Hummer, MD;  Location: [facility];  Service: Urology;  Laterality: N/A;                 Family History  Problem Relation Age of Onset   Prostate cancer Brother              Social History          Socioeconomic History   Marital status: Married      Spouse name: Not on file   Number of children: Not on file   Years of education: Not on file   Highest education level: Not on file  Occupational History   Not on file  Tobacco Use   Smoking status: Former      Current packs/day: 0.00      Average packs/day: 0.5 packs/day for 4.0 years (2.0 ttl pk-yrs)      Types: Cigarettes      Start date: 07/16/1998      Quit date: 07/16/2002      Years since quitting: 21.3   Smokeless tobacco: Never  Vaping Use   Vaping status: Never Used  Substance and Sexual Activity   Alcohol use: Yes      Alcohol/week: 6.0 standard drinks of alcohol  Types: 2 Shots of liquor, 4 Cans of beer per week      Comment: 12oz x2 of beer per day   Drug use: No   Sexual activity: Not Currently  Other Topics Concern   Not on file  Social History Narrative    05-29-18 Unable to ask abuse questions today wife with him.    Social Drivers of Acupuncturist Strain: Low Risk  (04/21/2021)    Overall Financial Resource Strain (CARDIA)     Difficulty of Paying Living Expenses: Not hard at all  Food Insecurity: No Food Insecurity (04/21/2021)    Hunger Vital Sign     Worried About Running Out of Food in the Last Year: Never true     Ran Out of Food in the Last Year: Never true  Transportation Needs: No Transportation Needs (04/21/2021)    PRAPARE - Therapist, art (Medical): No     Lack of Transportation (Non-Medical): No  Physical Activity: Sufficiently Active (04/21/2021)    Exercise Vital Sign     Days of Exercise per Week: 7 days     Minutes of Exercise per Session: 60 min  Stress: No Stress Concern Present (04/21/2021)    [employer] of Occupational Health - Occupational Stress Questionnaire     Feeling of Stress : Not at all  Social Connections: Moderately Isolated (04/21/2021)    Social Connection and Isolation Panel [NHANES]     Frequency  of Communication with Friends and Family: More than three times a week     Frequency of Social Gatherings with Friends and Family: Three times a week     Attends Religious Services: 1 to 4 times per year     Active Member of Clubs or Organizations: No     Attends Banker Meetings: Never     Marital Status: Separated  Intimate Partner Violence: Not At Risk (04/21/2021)    Humiliation, Afraid, Rape, and Kick questionnaire     Fear of Current or Ex-Partner: No     Emotionally Abused: No     Physically Abused: No     Sexually Abused: No        BP (!) 142/78   Pulse 69   Ht 5' 11 (1.803 m)   Wt 183 lb (83 kg)   SpO2 94%   BMI 25.52 kg/m    Physical Exam:   Well appearing 63 yo man, NAD HEENT: Unremarkable Neck:  No JVD, no thyromegally Lymphatics:  No adenopathy Back:  No CVA tenderness Lungs:  Clear with no wheezes HEART:  Regular rate rhythm, no murmurs, no rubs, no clicks Abd:  soft, positive bowel sounds, no organomegally, no rebound, no guarding Ext:  2 plus pulses, no edema, no cyanosis, no clubbing Skin:  No rashes no nodules Neuro:  CN II through XII intact, motor grossly intact   EKG - nsr     Assess/Plan: Atrial flutter - I have discussed the treatment options with the patient and recommend EP study and ablation. Because he has some AT/SVT, I will not use general anesthesia   Ryan Wilson COME

## 2024-03-16 NOTE — Discharge Instructions (Signed)

## 2024-03-17 ENCOUNTER — Encounter (HOSPITAL_COMMUNITY): Payer: Self-pay | Admitting: Internal Medicine

## 2024-03-17 ENCOUNTER — Telehealth (HOSPITAL_COMMUNITY): Payer: Self-pay

## 2024-03-17 NOTE — Telephone Encounter (Signed)
 Attempted to reach patient to follow up with procedure completed on 03/16/24, no answer. Left VM for patient to return call.

## 2024-03-19 MED FILL — Midazolam HCl Inj PF 5 MG/5ML (Base Equivalent): INTRAMUSCULAR | Qty: 5 | Status: AC

## 2024-03-19 MED FILL — Midazolam HCl Inj 2 MG/2ML (Base Equivalent): INTRAMUSCULAR | Qty: 4 | Status: AC

## 2024-03-19 MED FILL — Bupivacaine HCl Preservative Free (PF) Inj 0.25%: INTRAMUSCULAR | Qty: 30 | Status: AC

## 2024-04-16 ENCOUNTER — Ambulatory Visit: Attending: Student | Admitting: Cardiology

## 2024-04-16 ENCOUNTER — Encounter: Payer: Self-pay | Admitting: Student

## 2024-04-16 VITALS — BP 115/77 | HR 77 | Ht 70.0 in | Wt 178.6 lb

## 2024-04-16 DIAGNOSIS — R079 Chest pain, unspecified: Secondary | ICD-10-CM

## 2024-04-16 DIAGNOSIS — I483 Typical atrial flutter: Secondary | ICD-10-CM

## 2024-04-16 NOTE — Progress Notes (Signed)
  Electrophysiology Office Note:   Date:  04/16/2024  ID:  Ryan Wilson, DOB 20-Dec-1960, MRN [id number]  Primary Cardiologist: Ryan MARLA Red, MD Electrophysiologist: Ryan Kitty, MD   Electrophysiologist:  Ryan Kitty, MD      History of Present Illness:   Ryan Wilson is a 63 y.o. male with h/o atrial flutter and SVT seen today for routine electrophysiology follow-up s/p atrial flutter and SVT Ablation.  Since last being seen in our clinic the patient reports doing well. Reports one isolated episode where he thought his heart might start racing but states it never did. He has not taken any Toprol  XL since running out 2 weeks ago. he denies chest pain, palpitations, dyspnea, PND, orthopnea, nausea, vomiting, dizziness, syncope, edema, weight gain, or early satiety.    Review of systems complete and found to be negative unless listed in HPI.   EP Information / Studies Reviewed:    EKG is ordered today. Personal review as below.  EKG Interpretation Date/Time:  Friday April 16 2024 11:59:38 EDT Ventricular Rate:  77 PR Interval:  160 QRS Duration:  88 QT Interval:  368 QTC Calculation: 416 R Axis:   41  Text Interpretation: Normal sinus rhythm Nonspecific T wave abnormality low voltage in lead III Confirmed by Ryan Wilson [phone]) on 04/16/2024 12:01:32 PM    Arrhythmia/Device History No specialty comments available.   Physical Exam:   VS:  BP 115/77 (BP Location: Left Arm, Patient Position: Sitting)   Pulse 77   Ht 5' 10 (1.778 m)   Wt 178 lb 9.6 oz (81 kg)   SpO2 97%   BMI 25.63 kg/m    Wt Readings from Last 3 Encounters:  04/16/24 178 lb 9.6 oz (81 kg)  03/16/24 173 lb (78.5 kg)  01/21/24 183 lb (83 kg)     GEN: No acute distress NECK: No JVD; No carotid bruits CARDIAC: Regular rate and rhythm, no murmurs, rubs, gallops RESPIRATORY:  Clear to auscultation without rales, wheezing or rhonchi  ABDOMEN: Soft, non-tender, non-distended EXTREMITIES:  No edema;  No deformity   ASSESSMENT AND PLAN:    Atrial flutter s/p ablation SVT s/p ablation Patient doing well s/p ablation on 03/16/24 with Dr. Waddell. No symptoms of recurrent arrhythmia. Okay to stop ASA and Toprol  XL. Patient will see Dr. Kitty in 6 months.      Follow up with Dr. Kitty in 6 months  Signed, Wilson Trudy, PA-C

## 2024-04-16 NOTE — Patient Instructions (Signed)
 Medication Instructions:  Your physician recommends that you continue on your current medications as directed. Please refer to the Current Medication list given to you today.  *If you need a refill on your cardiac medications before your next appointment, please call your pharmacy*  Lab Work: NONE ordered at this time of appointment   Testing/Procedures: NONE ordered at this time of appointment    Follow-Up: At [facility], you and your health needs are our priority.  As part of our continuing mission to provide you with exceptional heart care, our providers are all part of one team.  This team includes your primary Cardiologist (physician) and Advanced Practice Providers or APPs (Physician Assistants and Nurse Practitioners) who all work together to provide you with the care you need, when you need it.  Your next appointment:   6 month(s)  Provider:   Dr. Kennyth   We recommend signing up for the patient portal called MyChart.  Sign up information is provided on this After Visit Summary.  MyChart is used to connect with patients for Virtual Visits (Telemedicine).  Patients are able to view lab/test results, encounter notes, upcoming appointments, etc.  Non-urgent messages can be sent to your provider as well.   To learn more about what you can do with MyChart, go to [email or web address].

## 2024-04-23 ENCOUNTER — Ambulatory Visit: Admitting: Nurse Practitioner

## 2024-04-23 VITALS — BP 118/86 | HR 86 | Temp 97.7°F | Ht 70.0 in | Wt 177.1 lb

## 2024-04-23 DIAGNOSIS — Z136 Encounter for screening for cardiovascular disorders: Secondary | ICD-10-CM

## 2024-04-23 DIAGNOSIS — L84 Corns and callosities: Secondary | ICD-10-CM

## 2024-04-23 DIAGNOSIS — G8929 Other chronic pain: Secondary | ICD-10-CM | POA: Diagnosis not present

## 2024-04-23 DIAGNOSIS — Z13220 Encounter for screening for lipoid disorders: Secondary | ICD-10-CM | POA: Diagnosis not present

## 2024-04-23 DIAGNOSIS — E785 Hyperlipidemia, unspecified: Secondary | ICD-10-CM | POA: Diagnosis not present

## 2024-04-23 DIAGNOSIS — M5441 Lumbago with sciatica, right side: Secondary | ICD-10-CM

## 2024-04-23 DIAGNOSIS — Z23 Encounter for immunization: Secondary | ICD-10-CM

## 2024-04-23 NOTE — Assessment & Plan Note (Signed)
 Right foot callus between first and second toe Persistent callus causing soreness, with a desire for surgical removal. - Refer to podiatry for evaluation and potential surgical removal.

## 2024-04-23 NOTE — Progress Notes (Signed)
 Established Patient Office Visit  Subjective   Patient ID: Ryan Wilson, male    DOB: Apr 10, 1961  Age: 63 y.o. MRN: [id number]  Chief Complaint  Patient presents with   Hyperlipidemia    Discussed the use of AI scribe software for clinical note transcription with the patient, who gave verbal consent to proceed.  History of Present Illness Ryan Wilson is a 63 year old male who presents for a flu shot and evaluation of sciatica.  Sciatic pain - Intermittent pain described as 'like needles' - Pain worsened by movements such as turning in bed or bending down - No use of specific medication for pain - Considering turmeric supplements for anti-inflammatory effects  Post-ablation status and cardiovascular symptoms - History of ablation for supraventricular tachycardia - Significant improvement in symptoms post-procedure - No current palpitations - Occasional lightheadedness since the procedure  Hyperlipidemia - Elevated cholesterol with LDL of 124 mg/dL recorded in March - Considering lifestyle modifications for cholesterol management  Right foot interdigital lesion - Bump located between the first and second toes of the right foot - Shaves down the lesion due to soreness - Interested in surgical removal  -Due for flu vaccine      Review of Systems  Respiratory:  Negative for shortness of breath.   Cardiovascular:  Negative for chest pain and palpitations.      Objective:     BP 118/86   Pulse 86   Temp 97.7 F (36.5 C) (Temporal)   Ht 5' 10 (1.778 m)   Wt 177 lb 2 oz (80.3 kg)   SpO2 97%   BMI 25.41 kg/m  BP Readings from Last 3 Encounters:  04/23/24 118/86  04/16/24 115/77  03/16/24 104/77   Wt Readings from Last 3 Encounters:  04/23/24 177 lb 2 oz (80.3 kg)  04/16/24 178 lb 9.6 oz (81 kg)  03/16/24 173 lb (78.5 kg)      Physical Exam Vitals reviewed.  Constitutional:      Appearance: Normal appearance.  HENT:     Head: Normocephalic and  atraumatic.  Cardiovascular:     Rate and Rhythm: Normal rate and regular rhythm.  Pulmonary:     Effort: Pulmonary effort is normal.     Breath sounds: Normal breath sounds.  Musculoskeletal:     Cervical back: Neck supple.  Skin:    General: Skin is warm and dry.  Neurological:     Mental Status: He is alert and oriented to person, place, and time.  Psychiatric:        Mood and Affect: Mood normal.        Behavior: Behavior normal.        Thought Content: Thought content normal.        Judgment: Judgment normal.      No results found for any visits on 04/23/24.    The 10-year ASCVD risk score (Arnett DK, et al., 2019) is: 7.4%    Assessment & Plan:   Problem List Items Addressed This Visit       Musculoskeletal and Integument   Callus   Right foot callus between first and second toe Persistent callus causing soreness, with a desire for surgical removal. - Refer to podiatry for evaluation and potential surgical removal.      Relevant Orders   Ambulatory referral to Podiatry     Other   Encounter for lipid screening for cardiovascular disease - Primary   Relevant Orders   CT CARDIAC SCORING (SELF PAY ONLY)  Lower back pain   Sciatica Intermittent sciatica with nerve root compression causing pain. Symptoms are self-limiting and manageable with physical therapy, rest, and medications. Discussed turmeric supplements for anti-inflammatory effects, with caution regarding supplement regulation. - Recommend physical therapy and moderate activity. - Suggest turmeric supplements for anti-inflammatory effects, with caution about supplement quality.      Relevant Orders   Ambulatory referral to Physical Therapy   Hyperlipidemia   Hyperlipidemia LDL cholesterol at 124 mg/dL, slightly above target. 10-year cardiovascular risk is 7.4%. Discussed lifestyle modifications and potential medication for plaque stabilization and risk reduction. CT scan for plaque assessment  discussed,  - Provide a handout on the Mediterranean diet. - Order a CT cardiac score of the heart for plaque assessment. If plaque is present will discuss lipid lowering therapy - Reassess cholesterol levels in six months.      Relevant Orders   CT CARDIAC SCORING (SELF PAY ONLY)   Need for vaccination   Relevant Orders   Flu vaccine trivalent PF, 6mos and older(Flulaval,Afluria,Fluarix,Fluzone) (Completed)   Assessment and Plan Assessment & Plan Right foot callus between first and second toe Persistent callus causing soreness, with a desire for surgical removal. - Refer to podiatry for evaluation and potential surgical removal.  Hyperlipidemia LDL cholesterol at 124 mg/dL, slightly above target. 10-year cardiovascular risk is 7.4%. Discussed lifestyle modifications and potential medication for plaque stabilization and risk reduction. CT scan for plaque assessment discussed,  - Provide a handout on the Mediterranean diet. - Order a CT cardiac score of the heart for plaque assessment. If plaque is present will discuss lipid lowering therapy - Reassess cholesterol levels in six months.  Sciatica Intermittent sciatica with nerve root compression causing pain. Symptoms are self-limiting and manageable with physical therapy, rest, and medications. Discussed turmeric supplements for anti-inflammatory effects, with caution regarding supplement regulation. - Recommend physical therapy and moderate activity. - Suggest turmeric supplements for anti-inflammatory effects, with caution about supplement quality.    Return in about 6 months (around 10/21/2024) for F/U with Lauraine.    Lauraine FORBES Pereyra, NP

## 2024-04-23 NOTE — Assessment & Plan Note (Signed)
 Sciatica Intermittent sciatica with nerve root compression causing pain. Symptoms are self-limiting and manageable with physical therapy, rest, and medications. Discussed turmeric supplements for anti-inflammatory effects, with caution regarding supplement regulation. - Recommend physical therapy and moderate activity. - Suggest turmeric supplements for anti-inflammatory effects, with caution about supplement quality.

## 2024-04-23 NOTE — Assessment & Plan Note (Signed)
 Hyperlipidemia LDL cholesterol at 124 mg/dL, slightly above target. 10-year cardiovascular risk is 7.4%. Discussed lifestyle modifications and potential medication for plaque stabilization and risk reduction. CT scan for plaque assessment discussed,  - Provide a handout on the Mediterranean diet. - Order a CT cardiac score of the heart for plaque assessment. If plaque is present will discuss lipid lowering therapy - Reassess cholesterol levels in six months.

## 2024-04-30 ENCOUNTER — Ambulatory Visit (INDEPENDENT_AMBULATORY_CARE_PROVIDER_SITE_OTHER)

## 2024-04-30 VITALS — Ht 70.0 in | Wt 177.1 lb

## 2024-04-30 DIAGNOSIS — M2011 Hallux valgus (acquired), right foot: Secondary | ICD-10-CM | POA: Diagnosis not present

## 2024-04-30 DIAGNOSIS — L84 Corns and callosities: Secondary | ICD-10-CM

## 2024-04-30 DIAGNOSIS — M2041 Other hammer toe(s) (acquired), right foot: Secondary | ICD-10-CM

## 2024-04-30 DIAGNOSIS — M79671 Pain in right foot: Secondary | ICD-10-CM | POA: Diagnosis not present

## 2024-04-30 NOTE — Progress Notes (Signed)
 Subjective:  Patient ID: Ryan Wilson, male    DOB: 1961-03-30,  MRN: [id number]  Chief Complaint  Patient presents with   Callouses    RM 5 new patient-callus between first and second toes, wants surgically Lan Pereyra, NP refer    63 y.o. male presents with the above complaint.  He states that he has had calluses in multiple locations on his feet for years.  The callus between his right 1st and 2nd toes has become very painful.  He does attempt to pare this down with a pumice stone.  He has attempted 1 toe spacer without significant relief.  Review of Systems: Negative except as noted in the HPI. Denies N/V/F/Ch.  Past Medical History:  Diagnosis Date   Back pain    Heart murmur    PER PATIENT WAS HEARD BY A NURSE; DENIES SYMPTOMS     Inguinal hernia    REPAIRED  IN 1990s   Prostate cancer (HCC)    No current outpatient medications on file.  Social History   Tobacco Use  Smoking Status Former   Current packs/day: 0.00   Average packs/day: 0.5 packs/day for 4.0 years (2.0 ttl pk-yrs)   Types: Cigarettes   Start date: 07/16/1998   Quit date: 07/16/2002   Years since quitting: 21.8  Smokeless Tobacco Never    Allergies  Allergen Reactions   No Known Allergies    Objective:  There were no vitals filed for this visit. Body mass index is 25.42 kg/m. Constitutional Well developed. Well nourished. Oriented to person, place, and time.  Vascular Dorsalis pedis pulses palpable bilaterally. Posterior tibial pulses palpable bilaterally. Capillary refill normal to all digits.  No cyanosis or clubbing noted. Pedal hair growth normal.  Neurologic Normal speech. Epicritic sensation to light touch grossly present bilaterally. Negative tinel sign at tarsal tunnel bilaterally.   Dermatologic Skin texture and turgor are within normal limits.  No open wounds. Hyperkeratotic skin lesions to the right foot intermediate to the 1st and 2nd digits, at the medial aspect of the  first metatarsophalangeal joint.  Only the lesion intermediate to the 1st and 2nd digit is painful.  Musculoskeletal: 5 out of 5 muscle strength all major pedal muscle groups.  Rectus foot shape.  Significant hallux abductovalgus deformity causing rubbing of the hallux onto the second digit which occurs in the area of hyperkeratotic lesions.  No medial column hypermobility.  Mild hammertoe deformity second, third toe   Radiographs: Taken and reviewed.  3 views of the right foot.  These demonstrate an increased intermetatarsal 1-2 angle of 15 degrees.  Tibial sesamoid position 5.  Moderate joint space narrowing at the first metatarsal phalangeal joint.  No significant, contributing hindfoot deformity.  No acute osseous lesions.  Assessment:   1. Callus between toes   2. Hallux abducto valgus, right   3. Hammertoe of right foot    Plan:  - Patient was evaluated and treated and all questions answered.  Callus between toes, HAV right foot - Had an extensive discussion with the patient regarding the etiology of his hyperkeratotic lesions being from the bunion deformity.  We did discuss that it is the rubbing of the hallux onto the second digit that is causing this pressure point.  He does express understanding of this. -Today, the painful lesions were trimmed down.  Minimal bleeding at 1 lesion, antibiotic ointment and Band-Aid applied.  He stated that the lesions felt much better following this debridement. -We did discuss that for long-term resolution, the  patient may require surgical intervention to alleviate hallux rubbing onto the second digit.  We discussed Lapidus versus first MTP fusion. Will discuss if he would like hammertoe deformities addressed as well.  He is going to attempt conservative therapy with wide shoes and a toe sleeve first. -He was provided with size medium silicone toe sleeve which was applied over the second digit   RTC as needed for surgical planning   Prentice Ovens, DPM  AACFAS Fellowship Trained Podiatric Surgeon [facility]

## 2024-05-07 ENCOUNTER — Ambulatory Visit (HOSPITAL_BASED_OUTPATIENT_CLINIC_OR_DEPARTMENT_OTHER)
Admission: RE | Admit: 2024-05-07 | Discharge: 2024-05-07 | Disposition: A | Discharge status: Home / Self Care | Payer: Self-pay | Source: Ambulatory Visit | Attending: Nurse Practitioner | Admitting: Nurse Practitioner

## 2024-05-07 DIAGNOSIS — E785 Hyperlipidemia, unspecified: Secondary | ICD-10-CM | POA: Insufficient documentation

## 2024-05-07 DIAGNOSIS — Z13220 Encounter for screening for lipoid disorders: Secondary | ICD-10-CM | POA: Insufficient documentation

## 2024-05-07 DIAGNOSIS — Z136 Encounter for screening for cardiovascular disorders: Secondary | ICD-10-CM | POA: Insufficient documentation

## 2024-05-08 ENCOUNTER — Ambulatory Visit: Payer: Self-pay | Admitting: Nurse Practitioner

## 2024-05-10 ENCOUNTER — Telehealth: Payer: Self-pay

## 2024-05-10 DIAGNOSIS — R1013 Epigastric pain: Secondary | ICD-10-CM | POA: Diagnosis not present

## 2024-05-10 DIAGNOSIS — R1033 Periumbilical pain: Secondary | ICD-10-CM | POA: Diagnosis not present

## 2024-05-10 DIAGNOSIS — K2990 Gastroduodenitis, unspecified, without bleeding: Secondary | ICD-10-CM | POA: Diagnosis not present

## 2024-05-10 NOTE — Telephone Encounter (Signed)
   Pre-operative Risk Assessment    Patient Name: Ryan Wilson  DOB: 23-Jan-1961 MRN: [id number]   Date of last office visit: 04/16/24 Date of next office visit: Not scheduled   Request for Surgical Clearance    Procedure:  EGD  Date of Surgery:  Clearance 05/25/24                                Surgeon:  Dr. Rollin Socks Group or Practice Name:  [facility] Phone number:  [phone] Fax number:  [phone]   Type of Clearance Requested:   - Medical    Type of Anesthesia:  Propofol    Additional requests/questions:    Bonney Ival LOISE Gerome   05/10/2024, 4:41 PM

## 2024-05-11 NOTE — Telephone Encounter (Signed)
   Patient Name: [name]  DOB: 08/09/1960 MRN: [id number]  Primary Cardiologist: Arun K Thukkani, MD  Chart reviewed as part of pre-operative protocol coverage.   He was recently seen in the clinic last month and was doing well status post SVT ablation.  If no new cardiovascular symptoms would be cleared to undergo upcoming EGD with Dr. Rollin without any further cardiac testing.   No medications indicated as needing held.  Will route this bundled recommendation to requesting provider via Epic fax function and remove from pre-op  pool. Please call with questions.  Orren LOISE Fabry, PA-C 05/11/2024, 7:29 AM

## 2024-05-25 DIAGNOSIS — R1013 Epigastric pain: Secondary | ICD-10-CM | POA: Diagnosis not present

## 2024-05-25 DIAGNOSIS — K2970 Gastritis, unspecified, without bleeding: Secondary | ICD-10-CM | POA: Diagnosis not present

## 2024-05-25 DIAGNOSIS — R1033 Periumbilical pain: Secondary | ICD-10-CM | POA: Diagnosis not present

## 2024-05-25 DIAGNOSIS — K2950 Unspecified chronic gastritis without bleeding: Secondary | ICD-10-CM | POA: Diagnosis not present

## 2024-06-30 DIAGNOSIS — K219 Gastro-esophageal reflux disease without esophagitis: Secondary | ICD-10-CM | POA: Diagnosis not present

## 2025-01-21 ENCOUNTER — Ambulatory Visit
